# Patient Record
Sex: Male | Born: 1980
Health system: Southern US, Community
[De-identification: ages and names within clinical notes are randomized; demographics above are authoritative.]

## PROBLEM LIST (undated history)

## (undated) DIAGNOSIS — F840 Autistic disorder: Secondary | ICD-10-CM

## (undated) DIAGNOSIS — I10 Essential (primary) hypertension: Secondary | ICD-10-CM

## (undated) DIAGNOSIS — F79 Unspecified intellectual disabilities: Secondary | ICD-10-CM

## (undated) DIAGNOSIS — F32A Depression, unspecified: Secondary | ICD-10-CM

## (undated) DIAGNOSIS — E785 Hyperlipidemia, unspecified: Secondary | ICD-10-CM

## (undated) HISTORY — DX: Essential (primary) hypertension: I10

## (undated) HISTORY — DX: Depression, unspecified: F32.A

## (undated) HISTORY — DX: Hyperlipidemia, unspecified: E78.5

## (undated) HISTORY — DX: Unspecified intellectual disabilities: F79

---
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
## 2002-10-15 ENCOUNTER — Emergency Department (HOSPITAL_COMMUNITY): Admission: EM | Admit: 2002-10-15 | Discharge: 2002-10-15 | Payer: Self-pay | Admitting: Emergency Medicine

## 2005-04-01 ENCOUNTER — Emergency Department (HOSPITAL_COMMUNITY): Admission: EM | Admit: 2005-04-01 | Discharge: 2005-04-01 | Payer: Self-pay | Admitting: Emergency Medicine

## 2012-09-07 ENCOUNTER — Ambulatory Visit: Payer: Self-pay | Admitting: Family Medicine

## 2012-12-06 ENCOUNTER — Encounter: Payer: Self-pay | Admitting: Family Medicine

## 2012-12-06 ENCOUNTER — Ambulatory Visit (INDEPENDENT_AMBULATORY_CARE_PROVIDER_SITE_OTHER): Payer: Medicaid Other | Admitting: Family Medicine

## 2012-12-06 VITALS — BP 118/78 | HR 76 | Resp 16 | Ht 70.0 in | Wt 206.0 lb

## 2012-12-06 DIAGNOSIS — Z23 Encounter for immunization: Secondary | ICD-10-CM

## 2012-12-06 DIAGNOSIS — E663 Overweight: Secondary | ICD-10-CM

## 2012-12-06 DIAGNOSIS — Z13220 Encounter for screening for lipoid disorders: Secondary | ICD-10-CM

## 2012-12-06 DIAGNOSIS — G931 Anoxic brain damage, not elsewhere classified: Secondary | ICD-10-CM

## 2012-12-06 DIAGNOSIS — Z6825 Body mass index (BMI) 25.0-25.9, adult: Secondary | ICD-10-CM

## 2012-12-06 DIAGNOSIS — F79 Unspecified intellectual disabilities: Secondary | ICD-10-CM

## 2012-12-06 DIAGNOSIS — R5381 Other malaise: Secondary | ICD-10-CM

## 2012-12-06 DIAGNOSIS — I6789 Other cerebrovascular disease: Secondary | ICD-10-CM

## 2012-12-06 DIAGNOSIS — E669 Obesity, unspecified: Secondary | ICD-10-CM | POA: Insufficient documentation

## 2012-12-06 NOTE — Patient Instructions (Addendum)
F/u in mid October.  Please cut back on cookies and chips, eat  More fruit and vegetables, drimnk water.  Keep active, 30 to 45 minutes daily  Weight loss goal of 7 to 10  Pounds  I think you should look into helping with animals  TDaP today  Exam today is excellent, just need to lose a little weight  CBC, fasting lipid, chem 7, TSH this week

## 2012-12-06 NOTE — Addendum Note (Signed)
Addended by: Abner Greenspan on: 12/06/2012 09:03 AM   Modules accepted: Orders

## 2012-12-06 NOTE — Addendum Note (Signed)
Addended by: Abner Greenspan on: 12/06/2012 08:46 AM   Modules accepted: Orders

## 2012-12-06 NOTE — Progress Notes (Signed)
  Subjective:    Patient ID: Gilbert Cohen, male    DOB: 04/27/1981, 32 y.o.   MRN: [id number]  HPI Pt in for establishment of care. He has brain injury as a neonate per Mom, has difficulty expressing himself, but is able to answer questions appropriately.States he was jaundiced, and she witnessed what appears to be aspiration with feed from his nostrils and unresponsiveness . Mother states she was hospitalized for 3 months prior to his birth, she had preterm labour and had cervical cerclage.  Goes to mental health, and is on citalopram only.Has no named psychiatric illness, has been on this for approx 2 years. Mom states in school he was "just slow" never had behavioral issues at schoool No h/o legal altercations or incarceration Has 2 sisters who have no mental health illness  Review of Systems History from Mom  Denies recent fever or chills. Denies sinus pressure, nasal congestion, ear pain or sore throat. Denies chest congestion, productive cough or wheezing. Denies chest pains, palpitations and leg swelling Denies abdominal pain, nausea, vomiting,diarrhea or constipation.   Denies dysuria, frequency, hesitancy or incontinence. Denies joint pain, swelling and limitation in mobility. Denies headaches, seizures, numbness, or tingling. Denies depression, anxiety or insomnia. Denies skin break down or rash.        Objective:   Physical Exam  Patient alert and in no cardiopulmonary distress.  HEENT: No facial asymmetry, EOMI, no sinus tenderness,  oropharynx pink and moist.  Neck supple no adenopathy.  Chest: Clear to auscultation bilaterally.  CVS: S1, S2 no murmurs, no S3.  ABD: Soft non tender. Bowel sounds normal.  Ext: No edema  MS: Adequate ROM spine, shoulders, hips and knees.  Skin: Intact, no ulcerations or rash noted.  Psych: Good eye contact, abnormal affect. Not anxious or depressed appearing.  CNS: CN 2-12 intact, power, and sensation normal  throughout.       Assessment & Plan:

## 2012-12-10 LAB — CBC WITH DIFFERENTIAL/PLATELET
Basophils Absolute: 0 10*3/uL (ref 0.0–0.1)
Basophils Relative: 1 % (ref 0–1)
Eosinophils Absolute: 0.3 10*3/uL (ref 0.0–0.7)
Hemoglobin: 15.3 g/dL (ref 13.0–17.0)
Lymphocytes Relative: 49 % — ABNORMAL HIGH (ref 12–46)
MCHC: 34.9 g/dL (ref 30.0–36.0)
MCV: 79.2 fL (ref 78.0–100.0)
Monocytes Absolute: 0.2 10*3/uL (ref 0.1–1.0)
Monocytes Relative: 7 % (ref 3–12)
Neutro Abs: 1.1 10*3/uL — ABNORMAL LOW (ref 1.7–7.7)
Neutrophils Relative %: 35 % — ABNORMAL LOW (ref 43–77)
Platelets: 194 10*3/uL (ref 150–400)
RDW: 15 % (ref 11.5–15.5)
WBC: 3.1 10*3/uL — ABNORMAL LOW (ref 4.0–10.5)

## 2012-12-10 LAB — LIPID PANEL
Cholesterol: 180 mg/dL (ref 0–200)
LDL Cholesterol: 124 mg/dL — ABNORMAL HIGH (ref 0–99)

## 2012-12-10 LAB — BASIC METABOLIC PANEL
Calcium: 9.7 mg/dL (ref 8.4–10.5)
Glucose, Bld: 93 mg/dL (ref 70–99)

## 2013-04-07 ENCOUNTER — Ambulatory Visit: Payer: Medicaid Other | Admitting: Family Medicine

## 2013-05-18 ENCOUNTER — Encounter (INDEPENDENT_AMBULATORY_CARE_PROVIDER_SITE_OTHER): Payer: Self-pay

## 2013-05-18 ENCOUNTER — Ambulatory Visit (INDEPENDENT_AMBULATORY_CARE_PROVIDER_SITE_OTHER): Payer: Medicaid Other | Admitting: Family Medicine

## 2013-05-18 ENCOUNTER — Encounter: Payer: Self-pay | Admitting: Family Medicine

## 2013-05-18 VITALS — BP 114/80 | HR 89 | Resp 16 | Ht 70.0 in | Wt 214.0 lb

## 2013-05-18 DIAGNOSIS — F79 Unspecified intellectual disabilities: Secondary | ICD-10-CM

## 2013-05-18 DIAGNOSIS — Z23 Encounter for immunization: Secondary | ICD-10-CM

## 2013-05-18 DIAGNOSIS — E785 Hyperlipidemia, unspecified: Secondary | ICD-10-CM

## 2013-05-18 DIAGNOSIS — F329 Major depressive disorder, single episode, unspecified: Secondary | ICD-10-CM

## 2013-05-18 DIAGNOSIS — Z6825 Body mass index (BMI) 25.0-25.9, adult: Secondary | ICD-10-CM

## 2013-05-18 DIAGNOSIS — E663 Overweight: Secondary | ICD-10-CM

## 2013-05-18 NOTE — Patient Instructions (Addendum)
F/u in 6 month, call if you need me before  Flu vaccine today  Keep active , and eat a lot of vegetable and fruit  Need to work on weight loss

## 2013-05-18 NOTE — Progress Notes (Signed)
  Subjective:    Patient ID: Gilbert Cohen, male    DOB: 20-Aug-1980, 32 y.o.   MRN: [id number]  HPI The PT is here for follow up and re-evaluation of chronic medical conditions, medication management and review of any available recent lab and radiology data.  Preventive health is updated, specifically  Immunization.   Continues to follow with psychiatry for mental health needs. The PT denies any adverse reactions to current medications since the last visit.  There are no new concerns.  There are no specific complaints       Review of Systems See HPI Pt was left alone in the office today by his Mom, intentionally, in the hope that he would establish a closer relationship with me as his provider. Interview went well. Answered direct questions appropriately, still very sparingly with little eye contact and aversion to physical contact as in a hand shake Denies recent fever or chills. Denies sinus pressure, nasal congestion, ear pain or sore throat. Denies chest congestion, productive cough or wheezing. Denies chest pains, palpitations and leg swelling Denies abdominal pain, nausea, vomiting,diarrhea or constipation.   Denies dysuria, frequency, hesitancy or incontinence. Denies joint pain, swelling and limitation in mobility. Denies headaches, seizures, numbness, or tingling. Denies uncontrolled  depression, anxiety or insomnia. Denies skin break down or rash.        Objective:   Physical Exam  Patient alert and  in no cardiopulmonary distress.  HEENT: No facial asymmetry, EOMI, no sinus tenderness,  oropharynx pink and moist.  Neck supple no adenopathy.  Chest: Clear to auscultation bilaterally.  CVS: S1, S2 no murmurs, no S3.  ABD: Soft non tender. Bowel sounds normal.  Ext: No edema  MS: Adequate ROM spine, shoulders, hips and knees.  Skin: Intact, no ulcerations or rash noted.  Psych: Poor  eye contact, blunted  affect.  not anxious or depressed  appearing.  CNS: CN 2-12 intact, power,  normal throughout.       Assessment & Plan:

## 2013-05-22 DIAGNOSIS — F329 Major depressive disorder, single episode, unspecified: Secondary | ICD-10-CM | POA: Insufficient documentation

## 2013-05-22 DIAGNOSIS — E785 Hyperlipidemia, unspecified: Secondary | ICD-10-CM | POA: Insufficient documentation

## 2013-05-22 NOTE — Assessment & Plan Note (Signed)
Hyperlipidemia:Low fat diet discussed and encouraged.  Updated lab in  6 month

## 2013-05-22 NOTE — Assessment & Plan Note (Signed)
Deteriorated. Patient re-educated about  the importance of commitment to a  minimum of 150 minutes of exercise per week. The importance of healthy food choices with portion control discussed. Encouraged to start a food diary, count calories and to consider  joining a support group. Sample diet sheets offered. Goals set by the patient for the next several months.    

## 2013-05-22 NOTE — Assessment & Plan Note (Signed)
-  Stable, followed by psychiatry.  

## 2013-05-22 NOTE — Assessment & Plan Note (Signed)
Improved interaction with pt at visit, more communicative

## 2013-11-15 ENCOUNTER — Ambulatory Visit (INDEPENDENT_AMBULATORY_CARE_PROVIDER_SITE_OTHER): Payer: Medicare Other | Admitting: Family Medicine

## 2013-11-15 ENCOUNTER — Encounter (INDEPENDENT_AMBULATORY_CARE_PROVIDER_SITE_OTHER): Payer: Self-pay

## 2013-11-15 ENCOUNTER — Encounter: Payer: Self-pay | Admitting: Family Medicine

## 2013-11-15 VITALS — BP 118/82 | HR 77 | Resp 16 | Ht 70.0 in | Wt 212.0 lb

## 2013-11-15 DIAGNOSIS — F329 Major depressive disorder, single episode, unspecified: Secondary | ICD-10-CM

## 2013-11-15 DIAGNOSIS — F32A Depression, unspecified: Secondary | ICD-10-CM

## 2013-11-15 DIAGNOSIS — G931 Anoxic brain damage, not elsewhere classified: Secondary | ICD-10-CM

## 2013-11-15 DIAGNOSIS — I6789 Other cerebrovascular disease: Secondary | ICD-10-CM

## 2013-11-15 DIAGNOSIS — E663 Overweight: Secondary | ICD-10-CM

## 2013-11-15 DIAGNOSIS — F3289 Other specified depressive episodes: Secondary | ICD-10-CM

## 2013-11-15 DIAGNOSIS — R5381 Other malaise: Secondary | ICD-10-CM

## 2013-11-15 DIAGNOSIS — I6782 Cerebral ischemia: Secondary | ICD-10-CM

## 2013-11-15 DIAGNOSIS — R5383 Other fatigue: Secondary | ICD-10-CM

## 2013-11-15 DIAGNOSIS — E785 Hyperlipidemia, unspecified: Secondary | ICD-10-CM

## 2013-11-15 NOTE — Patient Instructions (Addendum)
Annual wellness  In October, call if you need me before  You are doing well, keep it up  You have lost 2 pounds, that is good  Remember to cut back on sugar and fatty foods  cBC fasting lipid, chem 7, TSH and CBC in October , before visit

## 2013-11-15 NOTE — Assessment & Plan Note (Signed)
Hyperlipidemia:Low fat diet discussed and encouraged.  Updated lab needed at/ before next visit.  

## 2013-11-15 NOTE — Assessment & Plan Note (Signed)
Pt unable to tell the tuime on clock face, knows the month, noty the date and ha marked limitation in ability to verbally communicate. He is involved in a group home where he cleans daily, he lives with his Mother and is at baseline and stable

## 2013-11-15 NOTE — Assessment & Plan Note (Signed)
Improved. Pt applauded on succesful weight loss through lifestyle change, and encouraged to continue same. Weight loss goal set for the next several months.  

## 2013-11-15 NOTE — Progress Notes (Signed)
**Note Gilbert-Identified via Obfuscation**    Subjective:    Patient ID: Gilbert Cohen, male    DOB: [birth year], 33 y.o.   MRN: [id number]  HPI Pt in for routine follow up. He states he has no problems, ferels well, is eating well. Denies excessive allergy symptoms, cough, fevr or chills His Mom is not in the room with him, but if she had concerns she would make me aware.i did check after the visit, she has none He is mentally retarded and is therefore an unreliable historian   Review of Systems See HPI Denies recent fever or chills. Denies sinus pressure, nasal congestion, ear pain or sore throat. Denies chest congestion, productive cough or wheezing. Denies chest pains,  Denies abdominal pain, nausea, vomiting,diarrhea or constipation.   nce. Denies joint pain, swelling and limitation in mobility. Denies headaches,  Denies uncontrolled depression, anxiety or insomnia. Denies skin break down or rash.        Objective:   Physical Exam  BP 118/82  Pulse 77  Resp 16  Ht 5\' 10"  (1.778 m)  Wt 212 lb (96.163 kg)  BMI 30.42 kg/m2  SpO2 97% Patient alert and  in no cardiopulmonary distress.  HEENT: No facial asymmetry, EOMI, no sinus tenderness,  oropharynx pink and moist.  Neck supple no adenopathy.  Chest: Clear to auscultation bilaterally.  CVS: S1, S2 no murmurs, no S3.  ABD: Soft non tender. Bowel sounds normal.  Ext: No edema  MS: Adequate ROM spine, shoulders, hips and knees.  Skin: Intact, no ulcerations or rash noted.  Psych: Poor eye contact, flat affect. not anxious or depressed appearing.  CNS: CN 2-12 intact, power,  and sensation normal throughout.       Assessment & Plan:  Dyslipidemia Hyperlipidemia:Low fat diet discussed and encouraged.  Updated lab needed at/ before next visit.   Depression Treated by psychiatry, stable on current medication  Chronic hypoxic-ischemic brain injury Pt unable to tell the tuime on clock face, knows the month, noty the date and ha marked  limitation in ability to verbally communicate. He is involved in a group home where he cleans daily, he lives with his Mother and is at baseline and stable  Overweight (BMI 25.0-29.9) Improved. Pt applauded on succesful weight loss through lifestyle change, and encouraged to continue same. Weight loss goal set for the next several months.

## 2013-11-15 NOTE — Assessment & Plan Note (Signed)
Treated by psychiatry, stable on current medication

## 2014-04-08 LAB — CBC WITH DIFFERENTIAL/PLATELET
BASOS ABS: 0 10*3/uL (ref 0.0–0.1)
BASOS PCT: 1 % (ref 0–1)
Eosinophils Absolute: 0.1 10*3/uL (ref 0.0–0.7)
Eosinophils Relative: 2 % (ref 0–5)
HEMATOCRIT: 44.2 % (ref 39.0–52.0)
HEMOGLOBIN: 14.9 g/dL (ref 13.0–17.0)
Lymphocytes Relative: 41 % (ref 12–46)
Lymphs Abs: 1.6 10*3/uL (ref 0.7–4.0)
MCH: 27.2 pg (ref 26.0–34.0)
MCHC: 33.7 g/dL (ref 30.0–36.0)
MCV: 80.8 fL (ref 78.0–100.0)
MONO ABS: 0.3 10*3/uL (ref 0.1–1.0)
MONOS PCT: 8 % (ref 3–12)
NEUTROS ABS: 1.9 10*3/uL (ref 1.7–7.7)
NEUTROS PCT: 48 % (ref 43–77)
PLATELETS: 222 10*3/uL (ref 150–400)
RBC: 5.47 MIL/uL (ref 4.22–5.81)
RDW: 14.6 % (ref 11.5–15.5)
WBC: 3.9 10*3/uL — ABNORMAL LOW (ref 4.0–10.5)

## 2014-04-08 LAB — BASIC METABOLIC PANEL
BUN: 11 mg/dL (ref 6–23)
CHLORIDE: 105 meq/L (ref 96–112)
CO2: 26 meq/L (ref 19–32)
Calcium: 10.2 mg/dL (ref 8.4–10.5)
Creat: 1.25 mg/dL (ref 0.50–1.35)
Glucose, Bld: 96 mg/dL (ref 70–99)
POTASSIUM: 4.7 meq/L (ref 3.5–5.3)
Sodium: 141 mEq/L (ref 135–145)

## 2014-04-08 LAB — LIPID PANEL
CHOL/HDL RATIO: 5.8 ratio
Cholesterol: 185 mg/dL (ref 0–200)
HDL: 32 mg/dL — AB (ref >39)
LDL Cholesterol: 134 mg/dL — ABNORMAL HIGH (ref 0–99)
Triglycerides: 95 mg/dL (ref <150)
VLDL: 19 mg/dL (ref 0–40)

## 2014-04-08 LAB — TSH: TSH: 2.234 u[IU]/mL (ref 0.350–4.500)

## 2014-04-14 ENCOUNTER — Ambulatory Visit (INDEPENDENT_AMBULATORY_CARE_PROVIDER_SITE_OTHER): Payer: Medicare Other | Admitting: Family Medicine

## 2014-04-14 ENCOUNTER — Encounter: Payer: Self-pay | Admitting: Family Medicine

## 2014-04-14 ENCOUNTER — Encounter (INDEPENDENT_AMBULATORY_CARE_PROVIDER_SITE_OTHER): Payer: Self-pay

## 2014-04-14 VITALS — BP 120/80 | HR 98 | Resp 16 | Ht 70.0 in | Wt 198.1 lb

## 2014-04-14 DIAGNOSIS — F89 Unspecified disorder of psychological development: Secondary | ICD-10-CM

## 2014-04-14 DIAGNOSIS — Z23 Encounter for immunization: Secondary | ICD-10-CM

## 2014-04-14 DIAGNOSIS — F840 Autistic disorder: Secondary | ICD-10-CM

## 2014-04-14 DIAGNOSIS — Z0000 Encounter for general adult medical examination without abnormal findings: Secondary | ICD-10-CM | POA: Diagnosis not present

## 2014-04-14 NOTE — Assessment & Plan Note (Signed)
Annual exam as documented. Counseling done  re healthy lifestyle involving commitment to 150 minutes exercise per week, heart healthy diet, and attaining healthy weight.The importance of adequate sleep also discussed. Regular seat belt use  is also discussed.  Immunization  needs are specifically addressed at this visit.

## 2014-04-14 NOTE — Patient Instructions (Signed)
F/u in 6 month, call if you need me before  Flu vaccine today  Please cut back on fried and fatty foods , also sweets, bad cholesterol is slightly high, and blood sugar is upper normal

## 2014-04-14 NOTE — Progress Notes (Signed)
**Note Gilbert-Identified via Obfuscation** Subjective:    Patient ID: Gilbert Cohen, male    DOB: 1980/07/26, 33 y.o.   MRN: [id number]  HPI Preventive Screening-Counseling & Management   Patient present here today for a Medicare annual wellness visit.   Current Problems (verified)   Medications Prior to Visit Allergies (verified)   PAST HISTORY  Family History (verified)   Social History Currently lives with his mother, never smoked   Risk Factors  Current exercise habits:  States he walks a lot at home   Dietary issues discussed: encouraged to eat a heart healthy diet with a lot of fruits and vegetables and to limit red meat and fried foods    Cardiac risk factors: none  Depression Screen  (Note: if answer to either of the following is "Yes", a more complete depression screening is indicated)   Over the past two weeks, have you felt down, depressed or hopeless? No  Over the past two weeks, have you felt little interest or pleasure in doing things? No  Have you lost interest or pleasure in daily life? No  Do you often feel hopeless? No  Do you cry easily over simple problems? No   Activities of Daily Living  In your present state of health, do you have any difficulty performing the following activities?  Driving?: doesn't drive  Managing money?: mother handles finances  Feeding yourself?:no  Getting from bed to chair?:No Climbing a flight of stairs?:No Preparing food and eating?:No Bathing or showering?:No Getting dressed?:No Getting to the toilet?:No Using the toilet?:No Moving around from place to place?: No  Fall Risk Assessment In the past year have you fallen or had a near fall?:No Are you currently taking any medications that make you dizzy?:No   Hearing Difficulties: No Do you often ask people to speak up or repeat themselves?:No Do you experience ringing or noises in your ears?:No Do you have difficulty understanding soft or whispered voices?:No  Cognitive Testing  Alert? Yes  Normal Appearance?Yes,some intellectual disability  Oriented to person? Yes Place? Yes  Time? Yes  Displays appropriate judgment?Yes  Can read the correct time from a watch face? yes Are you having problems remembering things?No  Advanced Directives have been discussed with the patient?Yes, he is full code per Mom    List the Names of Other Physician/Practitioners you currently use:  Psychiatry, mental health  Indicate any recent Medical Services you may have received from other than [facility] providers in the past year (date may be approximate).   Assessment:    Annual Wellness Exam   Plan:     Medicare Attestation  I have personally reviewed:  The patient's medical and social history  Their use of alcohol, tobacco or illicit drugs  Their current medications and supplements  The patient's functional ability including ADLs,fall risks, home safety risks, cognitive, and hearing and visual impairment  Diet and physical activities  Evidence for depression or mood disorders  The patient's weight, height, BMI, and visual acuity have been recorded in the chart. I have made referrals, counseling, and provided education to the patient based on review of the above and I have provided the patient with a written personalized care plan for preventive services.      Review of Systems     Objective:   Physical Exam        Assessment & Plan:  Medicare annual wellness visit, initial Annual exam as documented. Counseling done  re healthy lifestyle involving commitment to 150 minutes exercise per week, heart  healthy diet, and attaining healthy weight.The importance of adequate sleep also discussed. Regular seat belt use  is also discussed.  Immunization  needs are specifically addressed at this visit.   Need for prophylactic vaccination and inoculation against influenza Vaccine administered at visit.

## 2014-04-14 NOTE — Assessment & Plan Note (Signed)
Vaccine administered at visit.  

## 2014-10-12 ENCOUNTER — Encounter: Payer: Self-pay | Admitting: Family Medicine

## 2014-10-12 ENCOUNTER — Ambulatory Visit (INDEPENDENT_AMBULATORY_CARE_PROVIDER_SITE_OTHER): Payer: Medicare Other | Admitting: Family Medicine

## 2014-10-12 VITALS — BP 120/84 | HR 88 | Resp 16 | Ht 70.0 in | Wt 198.1 lb

## 2014-10-12 DIAGNOSIS — E663 Overweight: Secondary | ICD-10-CM

## 2014-10-12 DIAGNOSIS — Z139 Encounter for screening, unspecified: Secondary | ICD-10-CM

## 2014-10-12 DIAGNOSIS — H543 Unqualified visual loss, both eyes: Secondary | ICD-10-CM | POA: Insufficient documentation

## 2014-10-12 DIAGNOSIS — F32A Depression, unspecified: Secondary | ICD-10-CM

## 2014-10-12 DIAGNOSIS — F840 Autistic disorder: Secondary | ICD-10-CM

## 2014-10-12 DIAGNOSIS — E785 Hyperlipidemia, unspecified: Secondary | ICD-10-CM | POA: Diagnosis not present

## 2014-10-12 DIAGNOSIS — F329 Major depressive disorder, single episode, unspecified: Secondary | ICD-10-CM

## 2014-10-12 NOTE — Assessment & Plan Note (Signed)
Controlled, no change in medication Followed by psychiatry 

## 2014-10-12 NOTE — Assessment & Plan Note (Signed)
Receiving individual supervision once more, reportedly now asking to be introduced to females

## 2014-10-12 NOTE — Patient Instructions (Signed)
Annual wellness October 4 or after  Call if you need me sooner  It is important that you exercise regularly at least 30 minutes 5 times a week. If you develop chest pain, have severe difficulty breathing, or feel very tired, stop exercising immediately and seek medical attention    CBc, fasting lipid, chem 7, CBc , TSH end September.  Follow low fat diet please  Glad that you now have glasses to help you

## 2014-10-12 NOTE — Progress Notes (Signed)
**Note Gilbert-Identified via Obfuscation** Subjective:    Patient ID: Gilbert Cohen, male    DOB: October 22, 1980, 34 y.o.   MRN: [id number]  HPI The PT is here for follow up and re-evaluation of chronic medical conditions, medication management and review of any available recent lab and radiology data.  Preventive health is updated, specifically  Immunization.   Questions or concerns regarding consultations or procedures which the PT has had in the interim are  Addressed.Currently has a case worker visiting twice weekly and [facility]. Recently got updated glasses The PT denies any adverse reactions to current medications since the last visit.  There are no new concerns voiced by pt or his mother, who is the reliable historian  There are no specific complaints       Review of Systems    See HPI Denies recent fever or chills. Denies sinus pressure, nasal congestion, ear pain or sore throat. Denies chest congestion, productive cough or wheezing. Denies chest pains, palpitations and leg swelling Denies abdominal pain, nausea, vomiting,diarrhea or constipation.   Denies dysuria, frequency, hesitancy or incontinence. Denies joint pain, swelling and limitation in mobility. Denies headaches, seizures, numbness, or tingling. Denies uncontrolled  depression, displaying increased anxiety  Intermittently. Mother denies recent aggressive behavior. Denies skin break down or rash.     Objective:   Physical Exam  BP 120/84 mmHg  Pulse 88  Resp 16  Ht 5\' 10"  (1.778 m)  Wt 198 lb 1.9 oz (89.867 kg)  BMI 28.43 kg/m2  SpO2 99% Patient alert and  in no cardiopulmonary distress.  HEENT: No facial asymmetry, EOMI,   oropharynx pink and moist.  Neck supple no JVD, no mass.  Chest: Clear to auscultation bilaterally.  CVS: S1, S2 no murmurs, no S3.Regular rate.  ABD: Soft non tender.   Ext: No edema  MS: Adequate ROM spine, shoulders, hips and knees.  Skin: Intact, no ulcerations or rash  noted.  Psych: Poor  eye contact, abnormal affect.  anxious not  depressed appearing.  CNS: CN 2-12 intact, power,  normal throughout.no focal deficits noted.       Assessment & Plan:  Overweight (BMI 25.0-29.9) Unchnaged Patient re-educated about  the importance of commitment to a  minimum of 150 minutes of exercise per week.  The importance of healthy food choices with portion control discussed. Encouraged to start a food diary, count calories and to consider  joining a support group. Sample diet sheets offered. Goals set by the patient for the next several months.   Wt Readings from Last 3 Encounters:  10/12/14 198 lb 1.9 oz (89.867 kg)  04/14/14 198 lb 1.9 oz (89.867 kg)  11/15/13 212 lb (96.163 kg)    Body mass index is 28.43 kg/(m^2).  Current exercise per week 150 minutes.    Depression Controlled, no change in medication Followed by psychiatry   Dyslipidemia Hyperlipidemia:Low fat diet discussed and encouraged.  CMP Latest Ref Rng 04/08/2014 12/10/2012  Glucose 70 - 99 mg/dL 96 93  BUN 6 - 23 mg/dL 11 12  Creatinine 4.090.50 - 1.35 mg/dL 8.111.25 9.141.07  Sodium 782135 - 145 mEq/L 141 140  Potassium 3.5 - 5.3 mEq/L 4.7 4.5  Chloride 96 - 112 mEq/L 105 105  CO2 19 - 32 mEq/L 26 30  Calcium 8.4 - 10.5 mg/dL 95.610.2 9.7    Lipid Panel     Component Value Date/Time   CHOL 185 04/08/2014 0814   TRIG 95 04/08/2014 0814   HDL 32*  04/08/2014 0814   CHOLHDL 5.8 04/08/2014 0814   VLDL 19 04/08/2014 0814   LDLCALC 134* 04/08/2014 0814      Updated lab needed at/ before next visit.    Autism spectrum disorder Receiving individual supervision once more, reportedly now asking to be introduced to females

## 2014-10-12 NOTE — Assessment & Plan Note (Signed)
Unchnaged Patient re-educated about  the importance of commitment to a  minimum of 150 minutes of exercise per week.  The importance of healthy food choices with portion control discussed. Encouraged to start a food diary, count calories and to consider  joining a support group. Sample diet sheets offered. Goals set by the patient for the next several months.   Wt Readings from Last 3 Encounters:  10/12/14 198 lb 1.9 oz (89.867 kg)  04/14/14 198 lb 1.9 oz (89.867 kg)  11/15/13 212 lb (96.163 kg)    Body mass index is 28.43 kg/(m^2).  Current exercise per week 150 minutes.

## 2014-10-12 NOTE — Assessment & Plan Note (Signed)
Hyperlipidemia:Low fat diet discussed and encouraged.  CMP Latest Ref Rng 04/08/2014 12/10/2012  Glucose 70 - 99 mg/dL 96 93  BUN 6 - 23 mg/dL 11 12  Creatinine 5.950.50 - 1.35 mg/dL 6.381.25 7.561.07  Sodium 433135 - 145 mEq/L 141 140  Potassium 3.5 - 5.3 mEq/L 4.7 4.5  Chloride 96 - 112 mEq/L 105 105  CO2 19 - 32 mEq/L 26 30  Calcium 8.4 - 10.5 mg/dL 29.510.2 9.7    Lipid Panel     Component Value Date/Time   CHOL 185 04/08/2014 0814   TRIG 95 04/08/2014 0814   HDL 32* 04/08/2014 0814   CHOLHDL 5.8 04/08/2014 0814   VLDL 19 04/08/2014 0814   LDLCALC 134* 04/08/2014 0814      Updated lab needed at/ before next visit.

## 2015-04-21 ENCOUNTER — Other Ambulatory Visit: Payer: Self-pay | Admitting: Family Medicine

## 2015-04-21 LAB — CBC WITH DIFFERENTIAL/PLATELET
Basophils Absolute: 0 10*3/uL (ref 0.0–0.1)
Basophils Relative: 1 % (ref 0–1)
EOS PCT: 3 % (ref 0–5)
Eosinophils Absolute: 0.1 10*3/uL (ref 0.0–0.7)
HEMATOCRIT: 44.9 % (ref 39.0–52.0)
HEMOGLOBIN: 15.2 g/dL (ref 13.0–17.0)
LYMPHS ABS: 1.6 10*3/uL (ref 0.7–4.0)
LYMPHS PCT: 43 % (ref 12–46)
MCH: 27.6 pg (ref 26.0–34.0)
MCHC: 33.9 g/dL (ref 30.0–36.0)
MCV: 81.5 fL (ref 78.0–100.0)
MONO ABS: 0.3 10*3/uL (ref 0.1–1.0)
MONOS PCT: 9 % (ref 3–12)
MPV: 11.4 fL (ref 8.6–12.4)
NEUTROS ABS: 1.7 10*3/uL (ref 1.7–7.7)
Neutrophils Relative %: 44 % (ref 43–77)
Platelets: 204 10*3/uL (ref 150–400)
RBC: 5.51 MIL/uL (ref 4.22–5.81)
RDW: 14.1 % (ref 11.5–15.5)
WBC: 3.8 10*3/uL — AB (ref 4.0–10.5)

## 2015-04-23 LAB — BASIC METABOLIC PANEL
BUN: 11 mg/dL (ref 7–25)
CHLORIDE: 104 mmol/L (ref 98–110)
CO2: 29 mmol/L (ref 20–31)
CREATININE: 1.22 mg/dL (ref 0.60–1.35)
Calcium: 10.1 mg/dL (ref 8.6–10.3)
GLUCOSE: 90 mg/dL (ref 65–99)
Potassium: 4.7 mmol/L (ref 3.5–5.3)
Sodium: 141 mmol/L (ref 135–146)

## 2015-04-23 LAB — LIPID PANEL
CHOL/HDL RATIO: 5.6 ratio — AB (ref <=5.0)
CHOLESTEROL: 168 mg/dL (ref 125–200)
HDL: 30 mg/dL — ABNORMAL LOW (ref >=40)
LDL Cholesterol: 107 mg/dL (ref <130)
TRIGLYCERIDES: 156 mg/dL — AB (ref <150)
VLDL: 31 mg/dL — AB (ref <30)

## 2015-04-24 ENCOUNTER — Encounter: Payer: Self-pay | Admitting: Family Medicine

## 2015-04-24 ENCOUNTER — Ambulatory Visit (INDEPENDENT_AMBULATORY_CARE_PROVIDER_SITE_OTHER): Payer: Medicare Other | Admitting: Family Medicine

## 2015-04-24 VITALS — BP 120/80 | HR 86 | Resp 16 | Ht 70.0 in | Wt 205.0 lb

## 2015-04-24 DIAGNOSIS — B351 Tinea unguium: Secondary | ICD-10-CM | POA: Insufficient documentation

## 2015-04-24 DIAGNOSIS — L0390 Cellulitis, unspecified: Secondary | ICD-10-CM | POA: Insufficient documentation

## 2015-04-24 DIAGNOSIS — Z0000 Encounter for general adult medical examination without abnormal findings: Secondary | ICD-10-CM

## 2015-04-24 DIAGNOSIS — L84 Corns and callosities: Secondary | ICD-10-CM | POA: Insufficient documentation

## 2015-04-24 DIAGNOSIS — Z23 Encounter for immunization: Secondary | ICD-10-CM | POA: Diagnosis not present

## 2015-04-24 DIAGNOSIS — L03818 Cellulitis of other sites: Secondary | ICD-10-CM

## 2015-04-24 LAB — HEPATIC FUNCTION PANEL
ALBUMIN: 4.5 g/dL (ref 3.6–5.1)
ALK PHOS: 52 U/L (ref 40–115)
ALT: 21 U/L (ref 9–46)
AST: 19 U/L (ref 10–40)
BILIRUBIN INDIRECT: 0.9 mg/dL (ref 0.2–1.2)
BILIRUBIN TOTAL: 1.1 mg/dL (ref 0.2–1.2)
Bilirubin, Direct: 0.2 mg/dL (ref <=0.2)
TOTAL PROTEIN: 7.1 g/dL (ref 6.1–8.1)

## 2015-04-24 LAB — TSH: TSH: 1.442 u[IU]/mL (ref 0.350–4.500)

## 2015-04-24 LAB — HIV ANTIBODY (ROUTINE TESTING W REFLEX): HIV 1&2 Ab, 4th Generation: NONREACTIVE

## 2015-04-24 MED ORDER — TERBINAFINE HCL 250 MG PO TABS: 250.0000 mg | ORAL_TABLET | Freq: Every day | ORAL | Status: DC

## 2015-04-24 MED ORDER — CEPHALEXIN 500 MG PO CAPS: 500.0000 mg | ORAL_CAPSULE | Freq: Two times a day (BID) | ORAL | Status: DC

## 2015-04-24 NOTE — Assessment & Plan Note (Signed)

## 2015-04-24 NOTE — Patient Instructions (Signed)
F/u for annual physical exam in 5 month, call if you need me sooner  Excellent labs  Flu vaccine today  Two new medications, for fungal toenail infection, and skin infection of left foot, you are alos referred to podiatrist  Please work on good  health habits so that your health will improve. 1. Commitment to daily physical activity for 30 to 60  minutes, if you are able to do this.  2. Commitment to wise food choices. Aim for half of your  food intake to be vegetable and fruit, one quarter starchy foods, and one quarter protein. Try to eat on a regular schedule  3 meals per day, snacking between meals should be limited to vegetables or fruits or small portions of nuts. 64 ounces of water per day is generally recommended, unless you have specific health conditions, like heart failure or kidney failure where you will need to limit fluid intake.  3. Commitment to sufficient and a  good quality of physical and mental rest daily, generally between 6 to 8 hours per day.  WITH PERSISTANCE AND PERSEVERANCE, THE IMPOSSIBLE , BECOMES THE NORM! [facility], we consider it a privelige to serve you.

## 2015-04-24 NOTE — Progress Notes (Signed)
Subjective:    Patient ID: Gilbert Cohen, male    DOB: [birth year], 34 y.o.   MRN: [id number]  HPI Preventive Screening-Counseling & Management   Patient present here today for a Medicare annual wellness visit.   Current Problems (verified)   Medications Prior to Visit Allergies (verified)   PAST HISTORY   Family History (verified)   Social History Austistic, Lives with mother, never smoker    Risk Factors  Current exercise habits:  Walks around at home   Dietary issues discussed: Heart healthy, low carb, low fat    Cardiac risk factors: none   Depression Screen  (Note: if answer to either of the following is "Yes", a more complete depression screening is indicated)   Over the past two weeks, have you felt down, depressed or hopeless? No  Over the past two weeks, have you felt little interest or pleasure in doing things? No  Have you lost interest or pleasure in daily life? No  Do you often feel hopeless? No  Do you cry easily over simple problems? No   Activities of Daily Living  In your present state of health, do you have any difficulty performing the following activities?  Driving?: doesn't drive , incapable due to MR Managing money?: Mother handles finances  Feeding yourself?:No Getting from bed to chair?:No Climbing a flight of stairs?:No Preparing food and eating?:No Bathing or showering?:No Getting dressed?:No Getting to the toilet?:No Using the toilet?:No Moving around from place to place?: No  Fall Risk Assessment In the past year have you fallen or had a near fall?:No Are you currently taking any medications that make you dizzy?:No   Hearing Difficulties: No Do you often ask people to speak up or repeat themselves?:No Do you experience ringing or noises in your ears?:No Do you have difficulty understanding soft or whispered voices?:No  Cognitive Testing  Alert? Yes Normal Appearance?Yes  Oriented to person? Yes Place? Yes  Time? Yes    Displays appropriate judgment?Yes  Can read the correct time from a watch face? yes Are you having problems remembering things?No  Advanced Directives have been discussed with the patient?Yes, full code per mother     List the Names of Other Physician/Practitioners you currently use:  Psychiatry   Indicate any recent Medical Services you may have received from other than [facility] providers in the past year (date may be approximate).   Assessment:    Annual Wellness Exam   Plan:     Medicare Attestation  I have personally reviewed:  The patient's medical and social history  Their use of alcohol, tobacco or illicit drugs  Their current medications and supplements  The patient's functional ability including ADLs,fall risks, home safety risks, cognitive, and hearing and visual impairment  Diet and physical activities  Evidence for depression or mood disorders  The patient's weight, height, BMI, and visual acuity have been recorded in the chart. I have made referrals, counseling, and provided education to the patient based on review of the above and I have provided the patient with a written personalized care plan for preventive services.      Review of Systems     Objective:   Physical Exam  BP 120/80 mmHg  Pulse 86  Resp 16  Ht  (1.778 m)  Wt 205 lb (92.987 kg)  BMI 29.41 kg/m2  SpO2 97%      Assessment & Plan:  Medicare annual wellness visit, subsequent Annual exam as documented. Counseling done  re healthy  lifestyle involving commitment to 150 minutes exercise per week, heart healthy diet, and attaining healthy weight.The importance of adequate sleep also discussed. Regular seat belt use and home safety, is also discussed. Changes in health habits are decided on by the patient with goals and time frames  set for achieving them. Immunization and cancer screening needs are specifically addressed at this visit.    Cellulitis Left foot infection under 4th and  5th toes, will rx 5 day keflex  Callus of foot Of left foot and long toenails with a lot of fungal infection, refer to podiatry

## 2015-04-24 NOTE — Assessment & Plan Note (Signed)
Of left foot and long toenails with a lot of fungal infection, refer to podiatry

## 2015-04-24 NOTE — Assessment & Plan Note (Signed)
Left foot infection under 4th and 5th toes, will rx 5 day keflex

## 2015-05-08 ENCOUNTER — Encounter: Payer: Self-pay | Admitting: Family Medicine

## 2015-09-05 ENCOUNTER — Encounter: Payer: Self-pay | Admitting: Family Medicine

## 2015-09-05 ENCOUNTER — Ambulatory Visit (INDEPENDENT_AMBULATORY_CARE_PROVIDER_SITE_OTHER): Payer: Medicare Other | Admitting: Family Medicine

## 2015-09-05 VITALS — BP 120/74 | HR 82 | Resp 18 | Ht 70.0 in | Wt 204.0 lb

## 2015-09-05 DIAGNOSIS — Z0000 Encounter for general adult medical examination without abnormal findings: Secondary | ICD-10-CM | POA: Diagnosis not present

## 2015-09-05 NOTE — Assessment & Plan Note (Signed)
Annual exam as documented. . Immunization  are specifically addressed at this visit.

## 2015-09-05 NOTE — Patient Instructions (Signed)
Annual wellness exam 10/18 or after  Fasting labs for October visit, please  Exam today is good

## 2015-09-05 NOTE — Progress Notes (Signed)
   Subjective:    Patient ID: Gilbert Cohen, male    DOB: 02-06-1981, 35 y.o.   MRN: [id number]  HPI Patient is in for annual physical exam. No other health concerns are expressed or addressed at the visit. Recent labs, if available are reviewed. Immunization is reviewed , and  updated if needed.    Review of Systems See HPI        Objective:   Physical Exam  BP 120/74 mmHg  Pulse 82  Resp 18  Ht  (1.778 m)  Wt 204 lb (92.534 kg)  BMI 29.27 kg/m2  SpO2 98%  Pleasant well nourished male, alert and oriented x 3, in no cardio-pulmonary distress. Afebrile. HEENT No facial trauma or asymetry. Sinuses non tender. EOMI,  External ears normal, tympanic membranes clear. Oropharynx moist, no exudate, good dentition. Neck: supple, no adenopathy,JVD or thyromegaly.No bruits.  Chest: Clear to ascultation bilaterally.No crackles or wheezes. Non tender to palpation    Cardiovascular system; Heart sounds normal,  S1 and  S2 ,no S3.  No murmur, or thrill. Apical beat not displaced Peripheral pulses normal.  Abdomen: Soft, non tender, no organomegaly or masses. No bruits. Bowel sounds normal. No guarding, tenderness or rebound.    Musculoskeletal exam: Full ROM of spine, hips , shoulders and knees. No deformity ,swelling or crepitus noted. No muscle wasting or atrophy.   Neurologic: Cranial nerves 2 to 12 intact. Power, tone ,sensation  normal throughout. No disturbance in gait. No tremor.  Skin: Intact, no ulceration, erythema , scaling or rash noted. Pigmentation normal throughout  Psych; AbNormal mood and affect, however unchanged as far as the patient is concerned        Assessment & Plan:  Annual physical exam Annual exam as documented. . Immunization  are specifically addressed at this visit.

## 2016-04-25 ENCOUNTER — Telehealth: Payer: Self-pay | Admitting: Family Medicine

## 2016-04-25 DIAGNOSIS — F32A Depression, unspecified: Secondary | ICD-10-CM

## 2016-04-25 DIAGNOSIS — F329 Major depressive disorder, single episode, unspecified: Secondary | ICD-10-CM

## 2016-04-25 DIAGNOSIS — E785 Hyperlipidemia, unspecified: Secondary | ICD-10-CM

## 2016-04-25 NOTE — Telephone Encounter (Signed)
Please update fasting lab order, going Oct 28th to the lab

## 2016-04-26 NOTE — Telephone Encounter (Signed)
Expired labs reordered  

## 2016-05-03 LAB — LIPID PANEL
CHOL/HDL RATIO: 6.9 ratio — AB (ref <=5.0)
Cholesterol: 186 mg/dL (ref 125–200)
HDL: 27 mg/dL — AB (ref >=40)
LDL CALC: 131 mg/dL — AB (ref <130)
TRIGLYCERIDES: 142 mg/dL (ref <150)
VLDL: 28 mg/dL (ref <30)

## 2016-05-03 LAB — CBC
HCT: 43.6 % (ref 38.5–50.0)
Hemoglobin: 14.7 g/dL (ref 13.2–17.1)
MCH: 27 pg (ref 27.0–33.0)
MCHC: 33.7 g/dL (ref 32.0–36.0)
MCV: 80 fL (ref 80.0–100.0)
MPV: 10.9 fL (ref 7.5–12.5)
PLATELETS: 181 10*3/uL (ref 140–400)
RBC: 5.45 MIL/uL (ref 4.20–5.80)
RDW: 13.9 % (ref 11.0–15.0)
WBC: 3.3 10*3/uL — ABNORMAL LOW (ref 3.8–10.8)

## 2016-05-03 LAB — BASIC METABOLIC PANEL
BUN: 14 mg/dL (ref 7–25)
CALCIUM: 9.5 mg/dL (ref 8.6–10.3)
CO2: 28 mmol/L (ref 20–31)
Chloride: 105 mmol/L (ref 98–110)
Creat: 1.3 mg/dL (ref 0.60–1.35)
Glucose, Bld: 93 mg/dL (ref 65–99)
Potassium: 4.5 mmol/L (ref 3.5–5.3)
SODIUM: 141 mmol/L (ref 135–146)

## 2016-05-03 LAB — TSH: TSH: 1.73 mIU/L (ref 0.40–4.50)

## 2016-05-07 ENCOUNTER — Encounter: Payer: Self-pay | Admitting: Family Medicine

## 2016-05-07 ENCOUNTER — Ambulatory Visit (INDEPENDENT_AMBULATORY_CARE_PROVIDER_SITE_OTHER): Payer: Medicare Other | Admitting: Family Medicine

## 2016-05-07 VITALS — BP 118/82 | HR 94 | Ht 70.0 in | Wt 212.0 lb

## 2016-05-07 DIAGNOSIS — Z0000 Encounter for general adult medical examination without abnormal findings: Secondary | ICD-10-CM | POA: Diagnosis not present

## 2016-05-07 DIAGNOSIS — Z23 Encounter for immunization: Secondary | ICD-10-CM | POA: Diagnosis not present

## 2016-05-07 NOTE — Assessment & Plan Note (Signed)

## 2016-05-07 NOTE — Patient Instructions (Signed)
F/u in 5 months, call if you need me before  Please reduce fatty foods and sweet foods  Start daily physical activity for 30 mins or more  Flu vaccine today

## 2016-05-07 NOTE — Progress Notes (Signed)
Preventive Screening-Counseling & Management   Patient present here today for a Medicare annual wellness visit.   Current Problems (verified)   Medications Prior to Visit Allergies (verified)   PAST HISTORY  Family History (verified)  Social History Lives at home with mother, autistic, doesn't smoke or drink    Risk Factors  Current exercise habits: states he exercises a little, walking around when able   Dietary issues discussed: Low fat, low carb, lots of fruits and vegetables    Cardiac risk factors: none   Depression Screen  (Note: if answer to either of the following is "Yes", a more complete depression screening is indicated)   Over the past two weeks, have you felt down, depressed or hopeless? No  Over the past two weeks, have you felt little interest or pleasure in doing things? No  Have you lost interest or pleasure in daily life? No  Do you often feel hopeless? No  Do you cry easily over simple problems? No   Activities of Daily Living  In your present state of health, do you have any difficulty performing the following activities?  Driving?: has never drove due to MR Managing money?: handled by mother Feeding yourself?:No Getting from bed to chair?:No Climbing a flight of stairs?:No Preparing food and eating?:No Bathing or showering?:No Getting dressed?:No Getting to the toilet?:No Using the toilet?:No Moving around from place to place?: No  Fall Risk Assessment In the past year have you fallen or had a near fall?:No Are you currently taking any medications that make you dizzy?:No   Hearing Difficulties: No Do you often ask people to speak up or repeat themselves?:No Do you experience ringing or noises in your ears?:No Do you have difficulty understanding soft or whispered voices?:No  Cognitive Testing  Alert? Yes Normal Appearance?Yes  Oriented to person? Yes Place? Yes  Time? Yes Displays appropriate judgment?no  Can read the correct time  from a watch face? Yes, correctly verbalized the time  Are you having problems remembering things?No  Advanced Directives have been discussed with the patient?Yes. Full code per mother   List the Names of Other Physician/Practitioners you currently use:  Psychiatry   Indicate any recent Medical Services you may have received from other than [facility] providers in the past year (date may be approximate).     Medicare Attestation  I have personally reviewed:  The patient's medical and social history  Their use of alcohol, tobacco or illicit drugs  Their current medications and supplements  The patient's functional ability including ADLs,fall risks, home safety risks, cognitive, and hearing and visual impairment  Diet and physical activities  Evidence for depression or mood disorders  The patient's weight, height, BMI, and visual acuity have been recorded in the chart. I have made referrals, counseling, and provided education to the patient based on review of the above and I have provided the patient with a written personalized care plan for preventive services.    Physical Exam BP 118/82   Pulse 94   Ht 5\' 10"  (1.778 m)   Wt 212 lb (96.2 kg)   SpO2 97%   BMI 30.42 kg/m    Assessment & Plan:  Medicare annual wellness visit, subsequent Annual exam as documented. Counseling done  re healthy lifestyle involving commitment to 150 minutes exercise per week, heart healthy diet, and attaining healthy weight.The importance of adequate sleep also discussed. Regular seat belt use and home safety, is also discussed. Changes in health habits are decided on by the patient  with goals and time frames  set for achieving them. Immunization and cancer screening needs are specifically addressed at this visit.   Need for prophylactic vaccination and inoculation against influenza After obtaining informed consent, the vaccine is  administered by LPN.

## 2016-05-07 NOTE — Assessment & Plan Note (Signed)
After obtaining informed consent, the vaccine is  administered by LPN.  

## 2016-09-08 ENCOUNTER — Ambulatory Visit (INDEPENDENT_AMBULATORY_CARE_PROVIDER_SITE_OTHER): Payer: Medicare Other | Admitting: Family Medicine

## 2016-09-08 ENCOUNTER — Encounter: Payer: Self-pay | Admitting: Family Medicine

## 2016-09-08 VITALS — BP 114/74 | HR 88 | Resp 15 | Ht 70.0 in | Wt 213.0 lb

## 2016-09-08 DIAGNOSIS — H918X3 Other specified hearing loss, bilateral: Secondary | ICD-10-CM

## 2016-09-08 DIAGNOSIS — E785 Hyperlipidemia, unspecified: Secondary | ICD-10-CM

## 2016-09-08 DIAGNOSIS — E663 Overweight: Secondary | ICD-10-CM

## 2016-09-08 DIAGNOSIS — H9190 Unspecified hearing loss, unspecified ear: Secondary | ICD-10-CM | POA: Insufficient documentation

## 2016-09-08 DIAGNOSIS — F840 Autistic disorder: Secondary | ICD-10-CM | POA: Diagnosis not present

## 2016-09-08 NOTE — Patient Instructions (Addendum)
Annual wellness Nov 2 or after, call if you need me sooner   Please call for lab order in Septemeber  You are referred for hearing testing  Please work on good  health habits so that your health will improve. 1. Commitment to daily physical activity for 30 to 60  minutes, if you are able to do this.  2. Commitment to wise food choices. Aim for half of your  food intake to be vegetable and fruit, one quarter starchy foods, and one quarter protein. Try to eat on a regular schedule  3 meals per day, snacking between meals should be limited to vegetables or fruits or small portions of nuts. 64 ounces of water per day is generally recommended, unless you have specific health conditions, like heart failure or kidney failure where you will need to limit fluid intake.  3. Commitment to sufficient and a  good quality of physical and mental rest daily, generally between 6 to 8 hours per day.  WITH PERSISTANCE AND PERSEVERANCE, THE IMPOSSIBLE , BECOMES THE NORM! Thank you  for choosing [facility]. We consider it a privelige to serve you.  Delivering excellent health care in a caring and  compassionate way is our goal.  Partnering with you,  so that together we can achieve this goal is our strategy.

## 2016-09-08 NOTE — Assessment & Plan Note (Signed)
Deteriorated. Patient re-educated about  the importance of commitment to a  minimum of 150 minutes of exercise per week.  The importance of healthy food choices with portion control discussed. Encouraged to start a food diary, count calories and to consider  joining a support group. Sample diet sheets offered. Goals set by the patient for the next several months.   Weight /BMI 09/08/2016 05/07/2016 09/05/2015  WEIGHT 213 lb 212 lb 204 lb  HEIGHT 5\' 10"  5\' 10"  5\' 10"   BMI 30.56 kg/m2 30.42 kg/m2 29.27 kg/m2

## 2016-09-08 NOTE — Progress Notes (Signed)
**Note Gilbert-Identified via Obfuscation**    Gilbert Cohen     MRN: [id number]      DOB: [birth year]   HPI Gilbert Cohen is here for follow up and re-evaluation of chronic medical conditions, medication management and review of any available recent lab and radiology data.  .  There are no new concerns.  There are no specific complaints   ROS Denies recent fever or chills. Denies sinus pressure, nasal congestion, ear pain or sore throat. Denies chest congestion, productive cough or wheezing. Denies chest pains, palpitations and leg swelling Denies abdominal pain, nausea, vomiting,diarrhea or constipation.   Denies dysuria, frequency, hesitancy or incontinence. Denies joint pain, swelling and limitation in mobility. Denies headaches, seizures, numbness, or tingling. Denies depression, anxiety or insomnia. Denies skin break down or rash.   PE  BP 114/74   Pulse 88   Resp 15   Ht 5\' 10"  (1.778 m)   Wt 213 lb (96.6 kg)   SpO2 99%   BMI 30.56 kg/m   Patient alert  and in no cardiopulmonary distress.  HEENT: No facial asymmetry, EOMI,   oropharynx pink and moist.  Neck supple no JVD, no mass.  Chest: Clear to auscultation bilaterally.  CVS: S1, S2 no murmurs, no S3.Regular rate.  ABD: Soft non tender.   Ext: No edema  MS: Adequate ROM spine, shoulders, hips and knees.  Skin: Intact, no ulcerations or rash noted.    CNS: CN 2-12 intact, power,  normal throughout.no focal deficits noted.   Assessment & Plan  Autism spectrum disorder Unchanged, depends on mom for function  Overweight (BMI 25.0-29.9) Deteriorated. Patient re-educated about  the importance of commitment to a  minimum of 150 minutes of exercise per week.  The importance of healthy food choices with portion control discussed. Encouraged to start a food diary, count calories and to consider  joining a support group. Sample diet sheets offered. Goals set by the patient for the next several months.   Weight /BMI 09/08/2016 05/07/2016 09/05/2015    WEIGHT 213 lb 212 lb 204 lb  HEIGHT 5\' 10"  5\' 10"  5\' 10"   BMI 30.56 kg/m2 30.42 kg/m2 29.27 kg/m2      Hearing loss Mother concerned about hearing loss refer for eval  Dyslipidemia Hyperlipidemia:Low fat diet discussed and encouraged.   Lipid Panel  Lab Results  Component Value Date   CHOL 186 05/03/2016   HDL 27 (L) 05/03/2016   LDLCALC 131 (H) 05/03/2016   TRIG 142 05/03/2016   CHOLHDL 6.9 (H) 05/03/2016      \

## 2016-09-08 NOTE — Assessment & Plan Note (Signed)
Mother concerned about hearing loss refer for eval

## 2016-09-08 NOTE — Assessment & Plan Note (Signed)
Hyperlipidemia:Low fat diet discussed and encouraged.   Lipid Panel  Lab Results  Component Value Date   CHOL 186 05/03/2016   HDL 27 (L) 05/03/2016   LDLCALC 131 (H) 05/03/2016   TRIG 142 05/03/2016   CHOLHDL 6.9 (H) 05/03/2016

## 2016-09-08 NOTE — Assessment & Plan Note (Signed)
Unchanged, depends on mom for function

## 2016-10-02 ENCOUNTER — Ambulatory Visit (INDEPENDENT_AMBULATORY_CARE_PROVIDER_SITE_OTHER): Payer: Medicare Other | Admitting: Otolaryngology

## 2016-10-02 DIAGNOSIS — H93293 Other abnormal auditory perceptions, bilateral: Secondary | ICD-10-CM | POA: Diagnosis not present

## 2016-10-02 DIAGNOSIS — H6122 Impacted cerumen, left ear: Secondary | ICD-10-CM

## 2017-03-05 ENCOUNTER — Emergency Department (HOSPITAL_COMMUNITY)
Admission: EM | Admit: 2017-03-05 | Discharge: 2017-03-06 | Disposition: A | Discharge status: Home / Self Care | Payer: Medicare Other | Attending: Emergency Medicine | Admitting: Emergency Medicine

## 2017-03-05 ENCOUNTER — Encounter (HOSPITAL_COMMUNITY): Payer: Self-pay | Admitting: Emergency Medicine

## 2017-03-05 DIAGNOSIS — R451 Restlessness and agitation: Secondary | ICD-10-CM | POA: Diagnosis present

## 2017-03-05 DIAGNOSIS — R454 Irritability and anger: Secondary | ICD-10-CM | POA: Diagnosis not present

## 2017-03-05 DIAGNOSIS — F329 Major depressive disorder, single episode, unspecified: Secondary | ICD-10-CM | POA: Diagnosis not present

## 2017-03-05 DIAGNOSIS — Z79899 Other long term (current) drug therapy: Secondary | ICD-10-CM | POA: Diagnosis not present

## 2017-03-05 DIAGNOSIS — F32A Depression, unspecified: Secondary | ICD-10-CM

## 2017-03-05 HISTORY — DX: Unspecified intellectual disabilities: F79

## 2017-03-05 HISTORY — DX: Autistic disorder: F84.0

## 2017-03-05 LAB — CBC WITH DIFFERENTIAL/PLATELET
Basophils Absolute: 0 10*3/uL (ref 0.0–0.1)
Basophils Relative: 1 %
EOS ABS: 0.2 10*3/uL (ref 0.0–0.7)
EOS PCT: 5 %
HCT: 45.9 % (ref 39.0–52.0)
HEMOGLOBIN: 15.7 g/dL (ref 13.0–17.0)
LYMPHS ABS: 1.8 10*3/uL (ref 0.7–4.0)
Lymphocytes Relative: 46 %
MCH: 27.8 pg (ref 26.0–34.0)
MCHC: 34.2 g/dL (ref 30.0–36.0)
MCV: 81.2 fL (ref 78.0–100.0)
MONO ABS: 0.2 10*3/uL (ref 0.1–1.0)
MONOS PCT: 6 %
NEUTROS PCT: 42 %
Neutro Abs: 1.6 10*3/uL — ABNORMAL LOW (ref 1.7–7.7)
Platelets: 198 10*3/uL (ref 150–400)
RBC: 5.65 MIL/uL (ref 4.22–5.81)
RDW: 13.7 % (ref 11.5–15.5)
WBC: 3.8 10*3/uL — ABNORMAL LOW (ref 4.0–10.5)

## 2017-03-05 LAB — BASIC METABOLIC PANEL
Anion gap: 10 (ref 5–15)
BUN: 16 mg/dL (ref 6–20)
CHLORIDE: 104 mmol/L (ref 101–111)
CO2: 25 mmol/L (ref 22–32)
CREATININE: 1.25 mg/dL — AB (ref 0.61–1.24)
Calcium: 9.5 mg/dL (ref 8.9–10.3)
GFR calc Af Amer: >60 mL/min (ref >60)
GFR calc non Af Amer: >60 mL/min (ref >60)
GLUCOSE: 102 mg/dL — AB (ref 65–99)
Potassium: 3.8 mmol/L (ref 3.5–5.1)
Sodium: 139 mmol/L (ref 135–145)

## 2017-03-05 LAB — RAPID URINE DRUG SCREEN, HOSP PERFORMED
AMPHETAMINES: NOT DETECTED
Barbiturates: NOT DETECTED
Benzodiazepines: NOT DETECTED
COCAINE: NOT DETECTED
OPIATES: NOT DETECTED
TETRAHYDROCANNABINOL: NOT DETECTED

## 2017-03-05 LAB — ETHANOL: Alcohol, Ethyl (B): <5 mg/dL (ref <5)

## 2017-03-05 MED ORDER — CITALOPRAM HYDROBROMIDE 20 MG PO TABS
40.0000 mg | ORAL_TABLET | Freq: Every morning | ORAL | Status: DC
  Administered 2017-03-06: 40 mg via ORAL
  Filled 2017-03-05 (×2): qty 2
  Filled 2017-03-05: qty 1
  Filled 2017-03-05: qty 2

## 2017-03-05 NOTE — BH Assessment (Signed)
**Note Gilbert-Identified via Obfuscation** Tele Assessment Note    Gilbert Cohen is an 36 y.o. male presenting [facility] follow [facility]. The patient lives with his mother, Gilbert Cohen, and [facility]. He was started on Celexa about four years ago and has been doing well on this medication until recently, the last few months. Mother describes the patient periodically has an angry affect, clinches his fist and gets in her face. The patient has not threatened anyone or himself and has not assaulted anyone. However, mother states this is not typically behavior for the patient. The patients sister, Gilbert Cohen, reports the patient is isolating more and has lost interest in usual pleasures. States the patient sits in his room in the dark. He has been beating on the furniture. Denies property damage. The patient spoke in one word responses to this clinician. Mother reports this is baseline behavior. Mother doesn't have an IQ score for him.   [facility] His next appointment was in October but due to increased aggression his appointment was moved up to today.  His sister drove in from out of town for the appointment. Mother reports, " he holds things in"  And then lashes out like he did at the mental center today. She stated he appeared he might attack someone but did not. The patient didn't have a completed evaluation or medication change and was sent to the hospital.   Gilbert Ruffina Okonkwo, Gilbert Cohen recommends overnight observation and am psychiatric evaluation   Diagnosis: Autism spectrum disorder; MDD, recurrent moderate, without psychosis  Past Medical History:  Past Medical History:  Diagnosis Date  . Autistic spectrum disorder   . Hyperlipidemia   . Intellectual disability   . Mental retardation 1982   hypoxic brain injury at birth    History reviewed. No pertinent surgical history.  Family History:  Family History  Problem Relation Age  of Onset  . Hypertension Mother   . Cancer Sister [phone]       localized breast cancer in 2014    Social History:  reports that he has never smoked. He has never used smokeless tobacco. He reports that he does not drink alcohol or use drugs.  Additional Social History:     CIWA: CIWA-Ar BP: 113/83 Pulse Rate: 80 COWS:    PATIENT STRENGTHS: (choose at least two) General fund of knowledge Supportive family/friends  Allergies: No Known Allergies  Home Medications:  (Not in a hospital admission)  OB/GYN Status:  No LMP for male patient.  General Assessment Data Location of Assessment: [facility] TTS Assessment: In system Is this a Tele or Face-to-Face Assessment?: Tele Assessment Is this an Initial Assessment or a Re-assessment for this encounter?: Initial Assessment Marital status: Single Is patient pregnant?: No Pregnancy Status: No Living Arrangements: Parent Can pt return to current living arrangement?: Yes Admission Status: Involuntary Is patient capable of signing voluntary admission?: Yes Referral Source: Other Insurance type: MCR  Medical Screening Exam [facility] Medical Exam completed: Yes  Crisis Care Plan Living Arrangements: Parent Name of Psychiatrist: [facility], Gilbert Cohen (every 3 or 4 months) Name of Therapist: n/a  Education Status Is patient currently in school?: No Highest grade of school patient has completed: senior  Risk to self with the past 6 months Suicidal Ideation: No Has patient been a risk to self within the past 6 months prior to admission? : No Suicidal Intent: No Has patient had any suicidal  intent within the past 6 months prior to admission? : No Is patient at risk for suicide?: No Suicidal Plan?: No Has patient had any suicidal plan within the past 6 months prior to admission? : No Access to Means: No What has been your use of drugs/alcohol within the last 12 months?: n/a Previous Attempts/Gestures: No How many times?: 0 Intentional  Self Injurious Behavior: None Family Suicide History: Unknown (schizophrenia- paternal ) Persecutory voices/beliefs?: No Depression: Yes Depression Symptoms: Isolating, Loss of interest in usual pleasures Substance abuse history and/or treatment for substance abuse?: No Suicide prevention information given to non-admitted patients: Not applicable  Risk to Others within the past 6 months Homicidal Ideation: No Does patient have any lifetime risk of violence toward others beyond the six months prior to admission? : No Thoughts of Harm to Others: No Current Homicidal Intent: No Current Homicidal Plan: No Access to Homicidal Means: No History of harm to others?: No Assessment of Violence: None Noted Does patient have access to weapons?: No Criminal Charges Pending?: No Does patient have a court date: No Is patient on probation?: No  Psychosis Hallucinations: None noted Delusions: None noted  Mental Status Report Appearance/Hygiene: Unremarkable Eye Contact: Poor Motor Activity: Freedom of movement Speech: Other (Comment) (one word answer) Level of Consciousness: Alert Mood: Euthymic Affect: Angry Anxiety Level: None Thought Processes: Coherent, Relevant Judgement: Unable to Assess Orientation: Unable to assess Obsessive Compulsive Thoughts/Behaviors: None  Cognitive Functioning Concentration: Unable to Assess Memory: Unable to Assess IQ: Below Average Insight: Unable to Assess Impulse Control: Poor Appetite: Poor (only once a day) Weight Loss: 0 Weight Gain: 0 Sleep: Decreased Total Hours of Sleep:  (sleeps during the day and up all night) Vegetative Symptoms: Staying in bed  ADLScreening [facility] Assessment Services) Patient's cognitive ability adequate to safely complete daily activities?: No  Prior Inpatient Therapy Prior Inpatient Therapy: No  Prior Outpatient Therapy Prior Outpatient Therapy: No Does patient have an ACCT team?: No Does patient have Intensive  In-House Services?  : No Does patient have [facility] services? : No Does patient have P4CC services?: No  ADL Screening (condition at time of admission) Patient's cognitive ability adequate to safely complete daily activities?: No Is the patient deaf or have difficulty hearing?: No Does the patient have difficulty seeing, even when wearing glasses/contacts?: No Does the patient have difficulty concentrating, remembering, or making decisions?: Yes       Abuse/Neglect Assessment (Assessment to be complete while patient is alone) Physical Abuse:  (UTA) Verbal Abuse:  (UTA) Sexual Abuse:  (UTA)     Advance Directives (For Healthcare) Does Patient Have a Medical Advance Directive?: No    Additional Information 1:1 In Past 12 Months?: No CIRT Risk: No Elopement Risk: No Does patient have medical clearance?: Yes     Disposition:  Disposition Initial Assessment Completed for this Encounter: Yes Disposition of Patient: Other dispositions Other disposition(s): Other (Comment)     Gilbert Cohen 03/05/2017 6:42 PM

## 2017-03-05 NOTE — ED Notes (Signed)
Sister is visiting. Pt is sitting in chair in room.

## 2017-03-05 NOTE — ED Provider Notes (Signed)
**Note Gilbert-Identified via Obfuscation** [facility]-EMERGENCY DEPT Provider Note   CSN: [id number] Arrival date & time: 03/05/17  1529     History   Chief Complaint Chief Complaint  Patient presents with  . V70.1    HPI Gilbert Cohen is a 36 y.o. male.  The history is provided by the patient and a relative. The history is limited by the condition of the patient and a developmental delay (Autism spectrum disorder, mild intellectual disability).    Pt was seen at 1555. Per Police, pt's family and pt: Pt brought to ED by Police, [facility], after becoming [facility]. Pt states he "doesn't know what happened" PTA. [facility] notes and family state pt's family called the clinic yesterday reporting pt "had a melt down the previous night" and she "had to call Police because he was jumping up and down while upset and she felt he was out of control." Family feels pt has been more depressed over the past several weeks with crying spells and increased sleep. Pt "exhibited aggressive posturing towards his mother specifically in the presence of his sister." Pt "had a tantrum in the small waiting room" [facility]. Pt's mother and sister stated they "did not feel safe with pt in the home." Pt has not tried to harm himself.   Past Medical History:  Diagnosis Date  . Autistic spectrum disorder   . Hyperlipidemia   . Intellectual disability   . Mental retardation 1982   hypoxic brain injury at birth    Patient Active Problem List   Diagnosis Date Noted  . Hearing loss 09/08/2016  . Onychomycosis 04/24/2015  . Callus of foot 04/24/2015  . Impaired vision in both eyes 10/12/2014  . Developmental disability 04/14/2014  . Autism spectrum disorder 04/14/2014  . Depression 05/22/2013  . Dyslipidemia 05/22/2013  . Chronic hypoxic-ischemic brain injury (HCC) 12/06/2012  . Overweight (BMI 25.0-29.9) 12/06/2012    History reviewed. No pertinent surgical history.     Home Medications    Prior to Admission  medications   Medication Sig Start Date End Date Taking? Authorizing Provider  citalopram (CELEXA) 40 MG tablet Take 40 mg by mouth daily.    [provider]    Family History Family History  Problem Relation Age of Onset  . Hypertension Mother   . Cancer Sister [phone]       localized breast cancer in 2014    Social History Social History  Substance Use Topics  . Smoking status: Never Smoker  . Smokeless tobacco: Never Used  . Alcohol use No     Allergies   Patient has no known allergies.   Review of Systems Review of Systems  Unable to perform ROS: Psychiatric disorder     Physical Exam Updated Vital Signs BP 140/83 (BP Location: Right Arm)   Pulse 80   Temp 98.3 F (36.8 C) (Oral)   Resp 18   Ht 5\' 7"  (1.702 m)   Wt 96.6 kg (213 lb)   SpO2 98%   BMI 33.36 kg/m   Physical Exam 1600:  Physical examination:  Nursing notes reviewed; Vital signs and O2 SAT reviewed;  Constitutional: Well developed, Well nourished, Well hydrated, In no acute distress; Head:  Normocephalic, atraumatic; Eyes: EOMI, PERRL, No scleral icterus; ENMT: Mouth and pharynx normal, Mucous membranes moist; Neck: Supple, Full range of motion; Cardiovascular: Regular rate and rhythm; Respiratory: Breath sounds clear, No wheezes.  Speaking full sentences with ease, Normal respiratory effort/excursion; Chest: No deformity, Movement normal;  Abdomen: Nondistended; Extremities: No deformity.; Neuro: Awake, alert. No facial droop. Speech clear. No gross focal motor deficits in extremities. Climbs on and off stretcher easily by himself. Gait steady.; Skin: Color normal, Warm, Dry.; Psych:  Affect flat.    ED Treatments / Results  Labs (all labs ordered are listed, but only abnormal results are displayed)   EKG  EKG Interpretation None       Radiology   Procedures Procedures (including critical care time)  Medications Ordered in ED Medications - No data to display   Initial  Impression / Assessment and Plan / ED Course  I have reviewed the triage vital signs and the nursing notes.  Pertinent labs & imaging results that were available during my care of the patient were reviewed by me and considered in my medical decision making (see chart for details).  MDM Reviewed: previous chart, nursing note and vitals Reviewed previous: labs Interpretation: labs    Results for orders placed or performed during the hospital encounter of 03/05/17  Urine rapid drug screen (hosp performed)  Result Value Ref Range   Opiates NONE DETECTED NONE DETECTED   Cocaine NONE DETECTED NONE DETECTED   Benzodiazepines NONE DETECTED NONE DETECTED   Amphetamines NONE DETECTED NONE DETECTED   Tetrahydrocannabinol NONE DETECTED NONE DETECTED   Barbiturates NONE DETECTED NONE DETECTED  Ethanol  Result Value Ref Range   Alcohol, Ethyl (B) <5 <5 mg/dL  Basic metabolic panel  Result Value Ref Range   Sodium 139 135 - 145 mmol/L   Potassium 3.8 3.5 - 5.1 mmol/L   Chloride 104 101 - 111 mmol/L   CO2 25 22 - 32 mmol/L   Glucose, Bld 102 (H) 65 - 99 mg/dL   BUN 16 6 - 20 mg/dL   Creatinine, Ser 1.61 (H) 0.61 - 1.24 mg/dL   Calcium 9.5 8.9 - 09.6 mg/dL   GFR calc non Af Amer >60 >60 mL/min   GFR calc Af Amer >60 >60 mL/min   Anion gap 10 5 - 15  CBC with Differential  Result Value Ref Range   WBC 3.8 (L) 4.0 - 10.5 K/uL   RBC 5.65 4.22 - 5.81 MIL/uL   Hemoglobin 15.7 13.0 - 17.0 g/dL   HCT 04.5 40.9 - 81.1 %   MCV 81.2 78.0 - 100.0 fL   MCH 27.8 26.0 - 34.0 pg   MCHC 34.2 30.0 - 36.0 g/dL   RDW 91.4 78.2 - 95.6 %   Platelets 198 150 - 400 K/uL   Neutrophils Relative % 42 %   Neutro Abs 1.6 (L) 1.7 - 7.7 K/uL   Lymphocytes Relative 46 %   Lymphs Abs 1.8 0.7 - 4.0 K/uL   Monocytes Relative 6 %   Monocytes Absolute 0.2 0.1 - 1.0 K/uL   Eosinophils Relative 5 %   Eosinophils Absolute 0.2 0.0 - 0.7 K/uL   Basophils Relative 1 %   Basophils Absolute 0.0 0.0 - 0.1 K/uL     1710:  Will have TTS evaluate.   1925:  TTS has evaluated pt: requests to hold pt in ED overnight for re-evaluation tomorrow morning. Holding orders written.     Final Clinical Impressions(s) / ED Diagnoses   Final diagnoses:  None    New Prescriptions New Prescriptions   No medications on file      Samuel Jester, DO 03/05/17 2140

## 2017-03-05 NOTE — ED Triage Notes (Signed)
Pt answers all questions at triage with "nuh uh"  Denies any thoughts of harming self.  [facility]  Pt states he is unsure of why he is here.  Police state county officers handed him off to them in parking lot and they are not sure what happened.

## 2017-03-06 ENCOUNTER — Telehealth: Payer: Self-pay | Admitting: Family Medicine

## 2017-03-06 DIAGNOSIS — R454 Irritability and anger: Secondary | ICD-10-CM

## 2017-03-06 DIAGNOSIS — R451 Restlessness and agitation: Secondary | ICD-10-CM | POA: Diagnosis not present

## 2017-03-06 NOTE — ED Notes (Signed)
Pt given meal tray at this time. NAD noted, denies need for restroom. Sitter outside of room.

## 2017-03-06 NOTE — Consult Note (Signed)
Telepsych Consultation   Reason for Consult: Aggressive Behavior   Referring Physician:  EDP Location of Patient:  Location of Provider: [facility]  Patient Identification: LUGENE BEOUGHER MRN:  [id number] Principal Diagnosis: <principal problem not specified> Diagnosis:   Patient Active Problem List   Diagnosis Date Noted  . Hearing loss [H91.90] 09/08/2016  . Onychomycosis [B35.1] 04/24/2015  . Callus of foot [L84] 04/24/2015  . Impaired vision in both eyes [H54.3] 10/12/2014  . Developmental disability [F89] 04/14/2014  . Autism spectrum disorder [F84.0] 04/14/2014  . Depression [F32.9] 05/22/2013  . Dyslipidemia [E78.5] 05/22/2013  . Chronic hypoxic-ischemic brain injury ([place]) [K09.38, G93.1] 12/06/2012  . Overweight (BMI 25.0-29.9) [E66.3] 12/06/2012    Total Time spent with patient: 30 minutes  Subjective:   Gilbert Cohen is a 36 y.o. male patient admitted with concerns of aggressive behavior. Patient seen via tele- assessment. Patient is awake, alert and oriented to person and place. Reports " mu family asked me to come here, but I don't know why". Patient continues to answers question with "yes and no" responses. Denies suicidal or homicidal ideation during this assessment. Denies auditory or visual hallucination and does not appear to be responding to internal stimuli. Wellington provided verbal permission to speak to his mother Lattie Haw. Mother reports patient has been taken the same medication for the past 4 years and she feels as if the medication need to be adjusted. Reports a recent visit with Psychiatric regarding medication adjustment and states patient became agaited when his sister made suggestion to change his medication. and  Support, encouragement and reassurance was provided.   HPI:  Gilbert Cohen is an 36 y.o. male presenting after he became aggressive at [facility] during a follow up appointment. The patient lives with his mother, Sheldon Sem, and attends a day program at [facility]. He was started on Celexa about four years ago and has been doing well on this medication until recently, the last few months. Mother describes the patient periodically has an angry affect, clinches his fist and gets in her face. The patient has not threatened anyone or himself and has not assaulted anyone. However, mother states this is not typically behavior for the patient. The patients sister, Layla Maw, reports the patient is isolating more and has lost interest in usual pleasures. States the patient sits in his room in the dark. He has been beating on the furniture. Denies property damage. The patient spoke in one word responses to this clinician. Mother reports this is baseline behavior. Mother doesn't have an IQ score for him.   The patient attends treatment at [facility]. His next appointment was in October but due to increased aggression his appointment was moved up to today.  His sister drove in from out of town for the appointment. Mother reports, " he holds things in"  And then lashes out like he did at the mental center today. She stated he appeared he might attack someone but did not. The patient didn't have a completed evaluation or medication change and was sent to the hospital.  Past Psychiatric History:  Risk to Self: Suicidal Ideation: No Suicidal Intent: No Is patient at risk for suicide?: No Suicidal Plan?: No Access to Means: No What has been your use of drugs/alcohol within the last 12 months?: n/a How many times?: 0 Intentional Self Injurious Behavior: None Risk to Others: Homicidal Ideation: No Thoughts of Harm to Others: No Current Homicidal Intent: No Current Homicidal Plan: No Access  to Homicidal Means: No History of harm to others?: No Assessment of Violence: None Noted Does patient have access to weapons?: No Criminal Charges Pending?: No Does patient have a court date: No Prior Inpatient Therapy: Prior Inpatient  Therapy: No Prior Outpatient Therapy: Prior Outpatient Therapy: No Does patient have an ACCT team?: No Does patient have Intensive In-House Services?  : No Does patient have [facility] services? : No Does patient have P4CC services?: No  Past Medical History:  Past Medical History:  Diagnosis Date  . Autistic spectrum disorder   . Hyperlipidemia   . Intellectual disability   . Mental retardation 1982   hypoxic brain injury at birth   History reviewed. No pertinent surgical history. Family History:  Family History  Problem Relation Age of Onset  . Hypertension Mother   . Cancer Sister 74       localized breast cancer in 2014   Family Psychiatric  History:  Social History:  History  Alcohol Use No     History  Drug Use No    Social History   Social History  . Marital status: Single    Spouse name: N/A  . Number of children: N/A  . Years of education: N/A   Social History Main Topics  . Smoking status: Never Smoker  . Smokeless tobacco: Never Used  . Alcohol use No  . Drug use: No  . Sexual activity: Not Currently   Other Topics Concern  . None   Social History Narrative  . None   Additional Social History:    Allergies:  No Known Allergies  Labs:  Results for orders placed or performed during the hospital encounter of 03/05/17 (from the past 48 hour(s))  Urine rapid drug screen (hosp performed)     Status: None   Collection Time: 03/05/17  4:21 PM  Result Value Ref Range   Opiates NONE DETECTED NONE DETECTED   Cocaine NONE DETECTED NONE DETECTED   Benzodiazepines NONE DETECTED NONE DETECTED   Amphetamines NONE DETECTED NONE DETECTED   Tetrahydrocannabinol NONE DETECTED NONE DETECTED   Barbiturates NONE DETECTED NONE DETECTED    Comment:        DRUG SCREEN FOR MEDICAL PURPOSES ONLY.  IF CONFIRMATION IS NEEDED FOR ANY PURPOSE, NOTIFY LAB WITHIN 5 DAYS.        LOWEST DETECTABLE LIMITS FOR URINE DRUG SCREEN Drug Class       Cutoff  (ng/mL) Amphetamine      1000 Barbiturate      200 Benzodiazepine   233 Tricyclics       007 Opiates          300 Cocaine          300 THC              50   Ethanol     Status: None   Collection Time: 03/05/17  4:33 PM  Result Value Ref Range   Alcohol, Ethyl (B) <5 <5 mg/dL    Comment:        LOWEST DETECTABLE LIMIT FOR SERUM ALCOHOL IS 5 mg/dL FOR MEDICAL PURPOSES ONLY   Basic metabolic panel     Status: Abnormal   Collection Time: 03/05/17  4:36 PM  Result Value Ref Range   Sodium 139 135 - 145 mmol/L   Potassium 3.8 3.5 - 5.1 mmol/L   Chloride 104 101 - 111 mmol/L   CO2 25 22 - 32 mmol/L   Glucose, Bld 102 (H) 65 - 99 mg/dL  BUN 16 6 - 20 mg/dL   Creatinine, Ser 1.25 (H) 0.61 - 1.24 mg/dL   Calcium 9.5 8.9 - 10.3 mg/dL   GFR calc non Af Amer >60 >60 mL/min   GFR calc Af Amer >60 >60 mL/min    Comment: (NOTE) The eGFR has been calculated using the CKD EPI equation. This calculation has not been validated in all clinical situations. eGFR's persistently <60 mL/min signify possible Chronic Kidney Disease.    Anion gap 10 5 - 15  CBC with Differential     Status: Abnormal   Collection Time: 03/05/17  4:36 PM  Result Value Ref Range   WBC 3.8 (L) 4.0 - 10.5 K/uL   RBC 5.65 4.22 - 5.81 MIL/uL   Hemoglobin 15.7 13.0 - 17.0 g/dL   HCT 45.9 39.0 - 52.0 %   MCV 81.2 78.0 - 100.0 fL   MCH 27.8 26.0 - 34.0 pg   MCHC 34.2 30.0 - 36.0 g/dL   RDW 13.7 11.5 - 15.5 %   Platelets 198 150 - 400 K/uL   Neutrophils Relative % 42 %   Neutro Abs 1.6 (L) 1.7 - 7.7 K/uL   Lymphocytes Relative 46 %   Lymphs Abs 1.8 0.7 - 4.0 K/uL   Monocytes Relative 6 %   Monocytes Absolute 0.2 0.1 - 1.0 K/uL   Eosinophils Relative 5 %   Eosinophils Absolute 0.2 0.0 - 0.7 K/uL   Basophils Relative 1 %   Basophils Absolute 0.0 0.0 - 0.1 K/uL    Medications:  Current Facility-Administered Medications  Medication Dose Route Frequency Provider Last Rate Last Dose  . citalopram (CELEXA) tablet  40 mg  40 mg Oral q morning - 10a Francine Graven, DO   40 mg at 03/06/17 1023   Current Outpatient Prescriptions  Medication Sig Dispense Refill  . citalopram (CELEXA) 40 MG tablet Take 40 mg by mouth every morning.       Musculoskeletal: Strength & Muscle Tone: within normal limits Gait & Station: UTA- teleassessment Patient leans: N/A  Psychiatric Specialty Exam: Physical Exam  Vitals reviewed. Neurological: He is alert.  Psychiatric: He has a normal mood and affect. His behavior is normal.    Review of Systems  Psychiatric/Behavioral: Negative for hallucinations. The patient does not have insomnia.     Blood pressure 128/78, pulse 69, temperature 98.3 F (36.8 C), temperature source Oral, resp. rate 16, height 5' 7" (1.702 m), weight 96.6 kg (213 lb), SpO2 98 %.Body mass index is 33.36 kg/m.  General Appearance: Casual  Eye Contact:  Fair  Speech:  Clear and Coherent and Slow  Volume:  Normal with fluctuation   Mood:  Irritable  Affect:  Flat  Thought Process:  Coherent  Orientation:  Other:  Person and place  Thought Content:  Hallucinations: None  Suicidal Thoughts:  No  Homicidal Thoughts:  No  Memory:  Immediate;   Fair Recent;   Fair  Judgement:  Other:  dx with developmetnal disability  Insight:  Present and patient was ble to articulated why he was in the hosptial, howvever reports he was unsure why?  Psychomotor Activity:  NA  Concentration:  Concentration: Fair  Recall:  [employer] of Knowledge:  Fair  Language:  Fair  Akathisia:  No  Handed:  Right  AIMS (if indicated):     Assets:  Communication Skills Desire for Improvement Social Support  ADL's:  Intact  Cognition:  Impaired,  Moderate  Sleep:  I agree with current treatment plan on 03/06/2017, Patient seen face-to-face for psychiatric evaluation follow-up, chart reviewed and case discussed with the MD Dwyane Dee  and Treatment team. Reviewed the information documented and agree with the  disposition plan. EDP MD Zamit made aware of current dispositon    Disposition: No evidence of imminent risk to self or others at present.   Patient does not meet criteria for psychiatric inpatient admission. Discussed crisis plan, support from social network, calling 911, coming to the Emergency Department, and calling Suicide Hotline. - TTS's to follow-up with  [facility] for a closer appointment and to provided addition resources.    This service was provided via telemedicine using a 2-way, interactive audio and video technology.  Names of all persons participating in this telemedicine service and their role in this encounter. Name: Eboni Role: RN  Name: Leafy Ro at shift change Role: RN  Name:Zamit Role:MD EDP       Derrill [place], NP 03/06/2017 11:55 AM

## 2017-03-06 NOTE — Progress Notes (Addendum)
Per [facility]-ED RN patient's sister, [facility]. Writer spoke with pt's sister Sharyne RichtersRonyetta Cohen [phone] who reported: "The only reason for us coming to the ED was to get his medication changed". Writer advised that unfortunately, the ED will not be able to prescribe medication. That an outpatient or inpatient psychiatrist would be able to do that. That once patient makes an appt with an OPT psychiatrist or once patient is recommended inpatient treatment and goes to an inpatient psych facility he will be able to get meds there.   Sister stated that the NP [facility] advised them that the ED would change pt's meds and reported understanding that was incorrect. Sister reported that she was on her way to pick up pt. Sister requested OPT psychiatrist contact information. Writer informed sister that Clinical research associatewriter is faxing OPT resources to the ED. [facility]-ED RN Maralyn SagoSarah aware. Resources faxed.  Gilbert Cohen, MSW, LCSWA Clinical social worker in disposition [facility], TTS Office [phone] and [phone] 03/06/2017 1:49 PM

## 2017-03-06 NOTE — ED Notes (Signed)
IVC rescinded. Pt's sister at bedside and discussed d/c instructions and follow up care. Denies further needs. Pt belongings given back at this time. [facility] to re-fax outside resources.

## 2017-03-06 NOTE — ED Notes (Signed)
Patient's mother, Hartley Barefootlisa Bamberg [phone], requests to be called upon evaluation by [facility].

## 2017-03-06 NOTE — Telephone Encounter (Signed)
Mother Mickle Mallory(Elisa) called to say that patient is @ the [facility] ER being evaluated - [facility].  She just wanted to keep you informed should you need to see him.

## 2017-03-06 NOTE — Telephone Encounter (Signed)
Noted and was aware that he is ED will follow

## 2017-03-06 NOTE — ED Notes (Signed)
[facility] spoke to pt's sister and clarified recommendations. [facility] pt's sister is on the way to come pick pt up and will be faxing over outpt resources.

## 2017-03-06 NOTE — ED Notes (Signed)
Pt's sister given [facility] resources. Ambulatory to d/c. Denies SI/HI at this time.

## 2017-03-06 NOTE — ED Notes (Signed)
Gave pt a meal tray  

## 2017-03-06 NOTE — ED Notes (Signed)
Pt.'s sister wants to be very informed of patient's condition. States she wants him to have a different mood stabilizer.

## 2017-03-06 NOTE — Discharge Instructions (Signed)
Go to your out patient appointment that behavior health has set up

## 2017-03-06 NOTE — ED Notes (Signed)
Tele psych in room. 

## 2017-04-08 ENCOUNTER — Telehealth: Payer: Self-pay | Admitting: Family Medicine

## 2017-04-08 ENCOUNTER — Emergency Department (HOSPITAL_COMMUNITY)
Admission: EM | Admit: 2017-04-08 | Discharge: 2017-04-10 | Disposition: A | Discharge status: Home / Self Care | Payer: Medicare Other | Attending: Emergency Medicine | Admitting: Emergency Medicine

## 2017-04-08 ENCOUNTER — Encounter (HOSPITAL_COMMUNITY): Payer: Self-pay | Admitting: Emergency Medicine

## 2017-04-08 DIAGNOSIS — F840 Autistic disorder: Secondary | ICD-10-CM | POA: Insufficient documentation

## 2017-04-08 DIAGNOSIS — Z79899 Other long term (current) drug therapy: Secondary | ICD-10-CM | POA: Insufficient documentation

## 2017-04-08 DIAGNOSIS — F78 Other intellectual disabilities: Secondary | ICD-10-CM | POA: Insufficient documentation

## 2017-04-08 DIAGNOSIS — R4689 Other symptoms and signs involving appearance and behavior: Secondary | ICD-10-CM

## 2017-04-08 LAB — COMPREHENSIVE METABOLIC PANEL
ALBUMIN: 4.9 g/dL (ref 3.5–5.0)
ALK PHOS: 52 U/L (ref 38–126)
ALT: 29 U/L (ref 17–63)
ANION GAP: 7 (ref 5–15)
AST: 28 U/L (ref 15–41)
BILIRUBIN TOTAL: 0.8 mg/dL (ref 0.3–1.2)
BUN: 19 mg/dL (ref 6–20)
CALCIUM: 9.5 mg/dL (ref 8.9–10.3)
CO2: 25 mmol/L (ref 22–32)
Chloride: 109 mmol/L (ref 101–111)
Creatinine, Ser: 1.35 mg/dL — ABNORMAL HIGH (ref 0.61–1.24)
GFR calc non Af Amer: >60 mL/min (ref >60)
GLUCOSE: 103 mg/dL — AB (ref 65–99)
POTASSIUM: 4.4 mmol/L (ref 3.5–5.1)
SODIUM: 141 mmol/L (ref 135–145)
TOTAL PROTEIN: 7.6 g/dL (ref 6.5–8.1)

## 2017-04-08 LAB — RAPID URINE DRUG SCREEN, HOSP PERFORMED
Amphetamines: NOT DETECTED
BENZODIAZEPINES: NOT DETECTED
Barbiturates: NOT DETECTED
COCAINE: NOT DETECTED
OPIATES: NOT DETECTED
Tetrahydrocannabinol: NOT DETECTED

## 2017-04-08 LAB — CBC WITH DIFFERENTIAL/PLATELET
BASOS PCT: 1 %
Basophils Absolute: 0 10*3/uL (ref 0.0–0.1)
EOS ABS: 0.1 10*3/uL (ref 0.0–0.7)
Eosinophils Relative: 2 %
HCT: 44.1 % (ref 39.0–52.0)
Hemoglobin: 14.9 g/dL (ref 13.0–17.0)
LYMPHS ABS: 1.3 10*3/uL (ref 0.7–4.0)
Lymphocytes Relative: 32 %
MCH: 27.3 pg (ref 26.0–34.0)
MCHC: 33.8 g/dL (ref 30.0–36.0)
MCV: 80.9 fL (ref 78.0–100.0)
MONO ABS: 0.3 10*3/uL (ref 0.1–1.0)
MONOS PCT: 8 %
Neutro Abs: 2.3 10*3/uL (ref 1.7–7.7)
Neutrophils Relative %: 57 %
Platelets: 194 10*3/uL (ref 150–400)
RBC: 5.45 MIL/uL (ref 4.22–5.81)
RDW: 13.8 % (ref 11.5–15.5)
WBC: 4.1 10*3/uL (ref 4.0–10.5)

## 2017-04-08 LAB — SALICYLATE LEVEL

## 2017-04-08 LAB — ETHANOL: Alcohol, Ethyl (B): <10 mg/dL (ref <10)

## 2017-04-08 LAB — ACETAMINOPHEN LEVEL

## 2017-04-08 MED ORDER — HYDROXYZINE HCL 25 MG PO TABS
25.0000 mg | ORAL_TABLET | Freq: Four times a day (QID) | ORAL | Status: DC | PRN
Start: 2017-04-08 — End: 2017-04-10

## 2017-04-08 MED ORDER — ONDANSETRON HCL 4 MG PO TABS: 4.0000 mg | ORAL_TABLET | Freq: Three times a day (TID) | ORAL | Status: DC | PRN

## 2017-04-08 MED ORDER — ACETAMINOPHEN 325 MG PO TABS: 650.0000 mg | ORAL_TABLET | ORAL | Status: DC | PRN

## 2017-04-08 MED ORDER — CITALOPRAM HYDROBROMIDE 20 MG PO TABS
40.0000 mg | ORAL_TABLET | Freq: Every day | ORAL | Status: DC
  Administered 2017-04-09: 40 mg via ORAL
  Filled 2017-04-08 (×2): qty 2
  Filled 2017-04-08: qty 1
  Filled 2017-04-08 (×2): qty 2
  Filled 2017-04-08: qty 1

## 2017-04-08 NOTE — ED Provider Notes (Signed)
[facility] Provider Note   CSN: [id number] Arrival date & time: 04/08/17  4098     History   Chief Complaint Chief Complaint  Patient presents with  . V70.1    HPI Gilbert Cohen is a 36 y.o. male.  The patient is verbal. But limited. But has a history of mental retardation and autism. Since July patient's been having unusual behavior that seems to be aggressive and his mother has been concerned. Patient has been evaluated by behavioral health in the past. He is on Celexa and hydroxyzine. Brought in by police on involuntary hold at the request of his mother. His mother is very concerned about aggressive posturing that's occurring at home and she is afraid that he is going to hurt her. His power of attorney is his sister, Ms. Laural Benes at telephone # [phone]. His mother's number is [phone]. Patient has been cooperative here and nonaggressive. Patient as stated was evaluated by your health for similar activity at the end of August.      Past Medical History:  Diagnosis Date  . Autistic spectrum disorder   . Hyperlipidemia   . Intellectual disability   . Mental retardation 1982   hypoxic brain injury at birth    Patient Active Problem List   Diagnosis Date Noted  . Hearing loss 09/08/2016  . Onychomycosis 04/24/2015  . Callus of foot 04/24/2015  . Impaired vision in both eyes 10/12/2014  . Developmental disability 04/14/2014  . Autism spectrum disorder 04/14/2014  . Depression 05/22/2013  . Dyslipidemia 05/22/2013  . Chronic hypoxic-ischemic brain injury (HCC) 12/06/2012  . Overweight (BMI 25.0-29.9) 12/06/2012    History reviewed. No pertinent surgical history.     Home Medications    Prior to Admission medications   Medication Sig Start Date End Date Taking? Authorizing Provider  citalopram (CELEXA) 40 MG tablet Take 40 mg by mouth every morning.    Yes [provider]    Family History Family History  Problem Relation Age of  Onset  . Hypertension Mother   . Cancer Sister 50       localized breast cancer in 2014    Social History Social History  Substance Use Topics  . Smoking status: Never Smoker  . Smokeless tobacco: Never Used  . Alcohol use No     Allergies   Patient has no known allergies.   Review of Systems Review of Systems  Unable to perform ROS: Psychiatric disorder     Physical Exam Updated Vital Signs BP 126/78 (BP Location: Right Arm)   Pulse 85   Temp 98.4 F (36.9 C) (Oral)   Resp 18   Ht 1.702 m ( )   Wt 95.3 kg (210 lb)   SpO2 99%   BMI 32.89 kg/m   Physical Exam  Constitutional: He appears well-developed and well-nourished. No distress.  HENT:  Head: Normocephalic and atraumatic.  Eyes: Pupils are equal, round, and reactive to light. Conjunctivae and EOM are normal.  Neck: Normal range of motion. Neck supple.  Cardiovascular: Normal rate and regular rhythm.   Pulmonary/Chest: Effort normal and breath sounds normal. No respiratory distress.  Abdominal: Soft. Bowel sounds are normal. There is no tenderness.  Musculoskeletal: Normal range of motion.  Neurological: He is alert.  Skin: Skin is warm.  Nursing note and vitals reviewed.    ED Treatments / Results  Labs (all labs ordered are listed, but only abnormal results are displayed) Labs Reviewed  COMPREHENSIVE METABOLIC PANEL - Abnormal; Notable for  the following:       Result Value   Glucose, Bld 103 (*)    Creatinine, Ser 1.35 (*)    All other components within normal limits  ACETAMINOPHEN LEVEL - Abnormal; Notable for the following:    Acetaminophen (Tylenol), Serum <10 (*)    All other components within normal limits  ETHANOL  RAPID URINE DRUG SCREEN, HOSP PERFORMED  CBC WITH DIFFERENTIAL/PLATELET  SALICYLATE LEVEL    EKG  EKG Interpretation None       Radiology No results found.  Procedures Procedures (including critical care time)  Medications Ordered in ED Medications - No  data to display   Initial Impression / Assessment and Plan / ED Course  I have reviewed the triage vital signs and the nursing notes.  Pertinent labs & imaging results that were available during my care of the patient were reviewed by me and considered in my medical decision making (see chart for details).   patient medically cleared for evaluation by behavioral health. Behavior health is not feel he meets criteria for behavioral health admission will probably need to have case management and social worker get involved. Patient mother feels that he is unsafe to be at home. Patient been cooperative here. Patient's involuntary commitment paperwork completed.    Final Clinical Impressions(s) / ED Diagnoses   Final diagnoses:  Autistic disorder  Aggressive behavior    New Prescriptions New Prescriptions   No medications on file     Vanetta Mulders, MD 04/08/17 2328

## 2017-04-08 NOTE — BH Assessment (Signed)
Tele Assessment Note   Patient Name: Gilbert Cohen MRN: [id number] Referring Physician: Dr. Vanetta Mulders Location of Patient: [facility] Location of Provider: Behavioral Health TTS Department  Gilbert Cohen is an 36 y.o. male.  -Clinician reviewed note by Dr. Deretha Emory.  The patient is verbal. But limited. But has a history of mental retardation and autism. Since July patient's been having unusual behavior that seems to be aggressive and his mother has been concerned. Patient has been evaluated by behavioral health in the past. He is on Celexa and hydroxyzine. Brought in by police on involuntary hold at the request of his mother. His mother is very concerned about aggressive posturing that's occurring at home and she is afraid that he is going to hurt her. His power of attorney is his sister, Ms. Laural Benes at telephone # [phone]. His mother's number is [phone]. Patient has been cooperative here and nonaggressive. Patient as stated was evaluated by your health for similar activity at the end of August.  Patient's mother took out IVC papers on patient after patient was behaving in an aggressive manner at home.  He was brought [facility] for evaluation.  Patient denies SI, HI or A/V hallucinations.  Patient has only one word answers for questions, does not elaborate.  Clinician called mother to get more information.  Patient gave verbal permission to call mother.  Sister is patient's POA.  Patient is his own guardian.  Mother said that patient has been acting aggressively towards her lately.  He has not hit her or pushed her but will yell and her and get close to her and be physically threatening.  He will ball up his fists as if to hit at times.  He did this on August 30 and had IVC papers taken out.  Patient was returned home.    Mother said that she does not know why he is acting so aggressive.  She is afraid of him now.  She said that it is just her and him in the home.  On Sunday, he  was being aggressive like this but was able to be redirected.  Patient goes to a day program called [facility] in [place].  Patient came home from the day program today and was acting aggressive, following mother.  She called a friend who invited her to church.  Patient said she went into her bathroom to change clothes.  When she opened the door patient was standing there and would not let her leave bathroom.  Patient then called police.  Even when police arrived, patient would follow mother around in an aggressive manner.  Mother then went and filed IVC papers.  Mother is unsure of him coming back home now, she feels unsafe.  Mother said that patient receives [facility].  His sister is his POA.  Pt's next appointment at [facility] is 04-14-17.  -Clinician discussed patient care with Donell Sievert, PA who recommended patient be observed for safety and stabilization and to have patient seen in AM for further dispositioning.  Patient may need social work consult too regarding possible placement.  Clinician discussed with Dr. Deretha Emory also.   Diagnosis: Autism Spectrum d/o; Mild I/DD  Past Medical History:  Past Medical History:  Diagnosis Date  . Autistic spectrum disorder   . Hyperlipidemia   . Intellectual disability   . Mental retardation 1982   hypoxic brain injury at birth    History reviewed. No pertinent surgical history.  Family History:  Family History  Problem Relation Age of Onset  . Hypertension Mother   . Cancer Sister 45       localized breast cancer in 2014    Social History:  reports that he has never smoked. He has never used smokeless tobacco. He reports that he does not drink alcohol or use drugs.  Additional Social History:  Alcohol / Drug Use Pain Medications: See PTA medication list Prescriptions: See PTA medication list Over the Counter: See PTA medication list History of alcohol / drug use?: No history of alcohol / drug abuse  CIWA:  CIWA-Ar BP: 126/78 Pulse Rate: 85 COWS:    PATIENT STRENGTHS: (choose at least two) Physical Health Supportive family/friends  Allergies: No Known Allergies  Home Medications:  (Not in a hospital admission)  OB/GYN Status:  No LMP for male patient.  General Assessment Data Location of Assessment: [facility] TTS Assessment: In system Is this a Tele or Face-to-Face Assessment?: Tele Assessment Is this an Initial Assessment or a Re-assessment for this encounter?: Initial Assessment Marital status: Single Is patient pregnant?: No Pregnancy Status: No Living Arrangements: Parent (Lives with mother) Can pt return to current living arrangement?: Yes Admission Status: Involuntary Is patient capable of signing voluntary admission?: No Referral Source: Self/Family/Friend Insurance type: [facility]     Crisis Care Plan Living Arrangements: Parent (Lives with mother) Legal Guardian: Other: Sharyne Richters [phone] sister & POA) Name of Psychiatrist: Floydene Flock, NP Wyatt Portela, NP) Name of Therapist: n/a  Education Status Is patient currently in school?: No Highest grade of school patient has completed: senior  Risk to self with the past 6 months Suicidal Ideation: No Has patient been a risk to self within the past 6 months prior to admission? : No Suicidal Intent: No Has patient had any suicidal intent within the past 6 months prior to admission? : No Is patient at risk for suicide?: No Suicidal Plan?: No Has patient had any suicidal plan within the past 6 months prior to admission? : No Access to Means: No What has been your use of drugs/alcohol within the last 12 months?: N/A Previous Attempts/Gestures: No How many times?: 0 Other Self Harm Risks: None Triggers for Past Attempts: None known Intentional Self Injurious Behavior: None Family Suicide History: No Recent stressful life event(s): Other (Comment) (Mother does not know why his behavior changed) Persecutory  voices/beliefs?: No Depression: Yes Depression Symptoms: Feeling worthless/self pity, Feeling angry/irritable Substance abuse history and/or treatment for substance abuse?: No Suicide prevention information given to non-admitted patients: Not applicable  Risk to Others within the past 6 months Homicidal Ideation: No Does patient have any lifetime risk of violence toward others beyond the six months prior to admission? : No Thoughts of Harm to Others: No Current Homicidal Intent: No Current Homicidal Plan: No Access to Homicidal Means: No Identified Victim: No one History of harm to others?: No Assessment of Violence: None Noted Violent Behavior Description: None Does patient have access to weapons?: No Criminal Charges Pending?: No Does patient have a court date: No Is patient on probation?: No  Psychosis Hallucinations: None noted Delusions: None noted  Mental Status Report Appearance/Hygiene: Unremarkable, In scrubs Eye Contact: Poor Motor Activity: Freedom of movement Speech: Other (Comment) (One word answers) Level of Consciousness: Alert Mood: Anxious Affect: Anxious Anxiety Level: Moderate Thought Processes: Unable to Assess Judgement: Unimpaired Orientation: Unable to assess Obsessive Compulsive Thoughts/Behaviors: None  Cognitive Functioning Concentration: Unable to Assess Memory: Unable to Assess IQ: Below Average Level of Function: Mild, unsure Insight:  Poor Impulse Control: Poor Appetite: Good Weight Loss: 0 Weight Gain: 0 Sleep: No Change Total Hours of Sleep: 8 Vegetative Symptoms: None  ADLScreening [facility] Assessment Services) Patient's cognitive ability adequate to safely complete daily activities?: Yes Patient able to express need for assistance with ADLs?: Yes Independently performs ADLs?: Yes (appropriate for developmental age)  Prior Inpatient Therapy Prior Inpatient Therapy: No Prior Therapy Dates: N/A Prior Therapy Facilty/Provider(s):  N/A Reason for Treatment: N/A  Prior Outpatient Therapy Prior Outpatient Therapy: Yes Prior Therapy Dates: 5-6 years Prior Therapy Facilty/Provider(s): [facility] in [place] Reason for Treatment: med management Does patient have an ACCT team?: No Does patient have Intensive In-House Services?  : No Does patient have [facility] services? : No Does patient have P4CC services?: No  ADL Screening (condition at time of admission) Patient's cognitive ability adequate to safely complete daily activities?: Yes Is the patient deaf or have difficulty hearing?: No Does the patient have difficulty seeing, even when wearing glasses/contacts?: No (Glasses) Does the patient have difficulty concentrating, remembering, or making decisions?: Yes (Autism dx) Patient able to express need for assistance with ADLs?: Yes Does the patient have difficulty dressing or bathing?: Yes (Pt can bathe self.) Independently performs ADLs?: Yes (appropriate for developmental age) Does the patient have difficulty walking or climbing stairs?: No Weakness of Legs: None Weakness of Arms/Hands: None       Abuse/Neglect Assessment (Assessment to be complete while patient is alone) Physical Abuse: Denies Verbal Abuse: Denies Sexual Abuse: Denies Exploitation of patient/patient's resources: Denies Self-Neglect: Denies     Merchant navy officer (For Healthcare) Does Patient Have a Medical Advance Directive?: No Would patient like information on creating a medical advance directive?: No - Patient declined    Additional Information 1:1 In Past 12 Months?: No CIRT Risk: No Elopement Risk: No Does patient have medical clearance?: Yes     Disposition:  Disposition Initial Assessment Completed for this Encounter: Yes Disposition of Patient: Other dispositions (PT to be reviewed by PA) Other disposition(s): Other (Comment) (Pt to be reviewed with PA)  This service was provided via telemedicine using a 2-way,  interactive audio and video technology.  Names of all persons participating in this telemedicine service and their role in this encounter. Name: Darshawn Boateng Role: mother  Name:  Role:   Name:  Role:   Name:  Role:     Alexandria Lodge 04/08/2017 11:55 PM

## 2017-04-08 NOTE — Telephone Encounter (Signed)
I do not recall the form being brought/faxed in. Do you have?

## 2017-04-08 NOTE — ED Triage Notes (Signed)
[employer] reports they were called out to his mother's residence today bc pt was anxious and standing very close to his mother and talking very loudly. PT denies any SI/HI or hallucinations or drug use.

## 2017-04-08 NOTE — Telephone Encounter (Signed)
Patients mother wants to know if we received patient FL2 form  Cb#: [phone]

## 2017-04-09 DIAGNOSIS — F840 Autistic disorder: Secondary | ICD-10-CM | POA: Diagnosis not present

## 2017-04-09 NOTE — ED Notes (Signed)
Pt ate everything from breakfast tray

## 2017-04-09 NOTE — ED Notes (Signed)
Pt eating lunch tray  

## 2017-04-09 NOTE — Telephone Encounter (Signed)
No I have not

## 2017-04-09 NOTE — Progress Notes (Signed)
The patient is recommended for a psychiatric evaluation this morning. Patient lives with his mother, Gilbert Cohen. His sister, Gilbert Cohen is his Healthcare POA.   CSW contacted the patient's mother, Gilbert Cohen ([phone]) to discuss collateral information regarding the patient.   Per Gilbert Cohen, she didn't sleep well last night due to thoughts about her son's behavior from yesterday. Gilbert Cohen stated that the patient goes to [facility] for medication management  and a day-program daily for 5-6 years. She reports that the patient has never acted out in this manner prior to July of this year.  Gilbert Cohen reports that the patient has became aggressive at home, however the patient has never physically attacked her. She states that yesterday the patient continuosly  stood over her in an aggressive posture, as if he was going to attack her.  She also stated that yesterday, the patient continued to followed her around the house and intimidated her with aggressive postures and stares.  Gilbert Cohen reports that she attempted to barricade herself in her bathroom out of fear that the patient was going to physically assault her or worse. She stated that the patient wouldn't let her leave the bathroom.   The patient's mother was very tearful and was crying while explaining the incident yesterday. She states that she is very terrified of the patient and that he cannot return to her home in this state.   Gilbert Cohen reports that the patient continued to follow her when the police arrived ather home. She reports that the police had to keep the patient from following her. She feels as thought the patient purposely intimidates her and knows that she is afraid of home. Gilbert Cohen states she has no additional supports, other than her daughter who lives in [place]. She states that she can no longer care for the patient.    CSW also contacted the patient's sister/ Healthcare POA, Gilbert Cohen [phone]) to discuss collateral information  regarding the patient. Per Gilbert Cohen, has been working with the patient's [facility] Ander Purpura [place], [phone]) to find a group home placement.   Per Gilbert Cohen, she spoke with the patient's day-program director, Clabe Seal  [phone]) and he doesn't think its appropriate for the patient to return home at this time.   Gilbert Cohen also reports that the patient is  is non-verbal. She feels as though the patient intimidates his mother to get his way. She stated that when he is brought to the ED he is calm, but once he returns home, the behaviors return.   Gilbert Cohen is very active in the patient's care and has been working vehemently on finding placement. CSW advised Gilbert Cohen that she should  contact the patient's Care Coordinator to discuss a possible emergency placement until they are able to find a group home placement with a higher level of care.      Gilbert Cohen stated the last time the patient was sent to the ED, she was under the impression that he could have  medication recommendations and be observed for a couple days.  CSW informed Gilbert Cohen that the patient would be re-evaluated this morning by a psychiatric provider and that a disposition would be made based on the whether the patient met criteria for inpatient treatment. Gilbert Cohen voiced understanding and CSW ensured her that someone would reach back out to her once a disposition was decided.   CSW will continue to follow.   Radonna Ricker MSW, [place] Disposition [phone]      .

## 2017-04-09 NOTE — Telephone Encounter (Signed)
Tried to call to inform, numbers are not working

## 2017-04-09 NOTE — Progress Notes (Signed)
CSW received a phone call from the patient's sister and Healthcare POA, Sharyne Richters [phone]).   She provided CSW with a contact number for a [facility], [place] [phone]).   She stated that the patient's care coordinator will contact Dispo CSW tomorrow morning regarding the patient's plan of care.   CSW will continue to follow.   Baldo Daub MSW, LCSWA CSW Disposition [phone]

## 2017-04-10 ENCOUNTER — Encounter (HOSPITAL_COMMUNITY): Payer: Self-pay | Admitting: Emergency Medicine

## 2017-04-10 NOTE — Discharge Instructions (Signed)
You were seen in the ED today with aggressive behavior. The social worker has two possible group home placements to consider. You can continue this process at home. Follow up with your PCP.

## 2017-04-10 NOTE — ED Provider Notes (Signed)
Blood pressure (!) 120/55, pulse 65, temperature (!) 97.4 F (36.3 C), temperature source Oral, resp. rate 18, height  (1.702 m), weight 95.3 kg (210 lb), SpO2 99 %.   In short, Gilbert Cohen is a 36 y.o. male with a chief complaint of V70.1 .  Refer to the original H&P for additional details.  12:29 PM Spoke with psychiatry team is states the patient is clear from their perspective. Unclear if mom will allow the patient back home because of intubating behavior. We will discuss with social work and Sports coach regarding disposition and placement updates.   Patient to be discharged to home while awaiting placement to group home. Two homes are considering.   Alona Bene, MD    Maia Plan, MD 04/10/17 [phone]

## 2017-04-10 NOTE — BH Assessment (Addendum)
[facility] Assessment Progress Note  Pt reassessed this morning. Pt presented with irritable affect and mood, but was cooperative and answered questions. Pt denied SI, HI, AVH. He answered all questions with "uh-uh" or "uh-huh". Clinician attempted to process with pt about him intimidating his mother. Clinician asked pt if he knew what intimidate meant and pt said "like threaten". Clinician asked pt about following his mom around the home, yelling at her, balling his fists up, and getting in her face. Pt didn't definitively admit to doing it, but denied saying anything to her ("I didn't say nothing to her"). Clinician asked pt if he loved his mom ("uh huh"), if he wanted to hurt her or if he had some feeling/compulsion to hurt or scare her ("uh uh"). Clinician asked pt several different times and ways if there was a reason why he was reportedly intimidating his mom but pt was unable to give a reason and appeared to lack insight into the matter. Pt has had no behavioral outburst while in the ED.  Case staffed with Assunta Found, NP, and Dr. Lucianne Muss. Pt is psychiatrically cleared and it is recommended that he be referred for a SW consult to f/u on placement options. Pt has a [facility], [place] [phone]). Dr. Jacqulyn Bath, EDP, notified.     Johny Shock. Ladona Ridgel, MS, NCC, LPCA Counselor

## 2017-04-10 NOTE — ED Notes (Signed)
Pt resting, will arouse, no distress noted, sitter remains at bedside,  

## 2017-04-10 NOTE — ED Notes (Signed)
Pt here after report of attempting to intimidate mother  SW CM has been attempting to find placement for patient, thus far unsuccessful  Here now for "med adjustment" and placement

## 2017-04-10 NOTE — ED Notes (Signed)
Pt lying on right side, no distress noted, sitter at bedside,

## 2017-04-10 NOTE — ED Notes (Signed)
Social work consulted. 

## 2017-04-10 NOTE — Progress Notes (Addendum)
Per Assunta Found, NP and Dr. Lucianne Muss the patient has been Psych Cleared. The patient is recommended for a SOCIAL WORK Consult due to him not being able to return home to his mother. The patient is [facility] CLEAR[facility].    The patient's sister and Healthcare POA is very active in his care and has been working with [facility] to seek group home or adult care placement.   Sister/Healthcare POA, Sharyne Richters [phone]).    [facility] Care Coordinators - [place] [phone]) Derrill Memo [phone]  Dispo CSW attempted to notify [facility] CSW, however there was no answer at this time. CSW left HIPAA compliant voicemail. [facility] CSW, Tretha Sciara [phone]).   Baldo Daub MSW, LCSWA CSW Disposition [phone]

## 2017-04-10 NOTE — ED Notes (Signed)
TTS has reevaluated  Awaiting placement and or dispo

## 2017-04-10 NOTE — ED Notes (Signed)
Mother upset that this RN mentioned placement in front of pt As part of DC instructions  Mother encouraged to return w pt should she have any concerns regarding her or pt safety

## 2017-04-10 NOTE — Consult Note (Signed)
**Note Gilbert-Identified via Obfuscation** Gilbert Cohen, 35 y.o., male patient seen by this provider on 04/10/17.  Chart reviewed and consulted with Dr. Lucianne Muss.  On evaluation [name] responded to most questions with uhm (no) or uha (yes).  Patient denies suicidal/homicidal ideation, psychosis, and paranoia.  Patient had to be asked question that needed a specific answer in order to get him to talk and not a question that required a yes or no answer.  Reported that he worked and where and was able to give his home address.  Sated that he did not know why he was in the hospital.   During interview patient seemed irritated that I was asking him questions and did not want to talk.  He did not have an outburst but did not want to answer questions.  Patient is alert/oriented x 3, calm/cooperative.  He did not appear to be responding to internal or external stimuli.  Patient is psychiatrically cleared.  Social work will work with patient on placement.  Patient is unable to go back home with his mother related to her stating that she is afraid of him coming back home.  Patient sister is his health care power of attorney.     Ismar Yabut B. Malyiah Fellows FNP-BC

## 2017-04-10 NOTE — Clinical Social Work Note (Signed)
LCSW spoke with Genia Harold [phone]. She advised that she is the supervisor of Catarina Hartshorn [phone], who is patient's Care Coordinator at [facility].  She advised that they have two [facility] group homes considering patient for placement. Ms. Terri Skains stated that they will not have to wait for authorization. LCSW discussed that patient was both medically and psychiatrically cleared and he could not remain in the emergency department while placement was sought. Ms. Terri Skains advised that patient's mom is his guardian so she would have to pick him up if we "could not let him stay through the weekend." She stated that she feels mom has "had enough" of patient's behaviors.  LCSW spoke with Catarina Hartshorn, Care Coordinator. She confirmed that two facilities were considering patient. She stated that patient's mother and sister were going to look at the facilities this weekend and asked if patient could stay in the ED through the weekend. LCSW advised that patient could not stay in the ED through the weekend and that someone would need to pick patient up today. She stated that she had been working on a placement for patient for the past six weeks. She stated that she would need to speak with patient's sister and mother to advise his mother to pick patient up.   LCSW spoke with patient's sister, Sharyne Richters [phone]. LCSW confirmed that patient was medically and psych cleared.  LCSW advised that patient would have to be picked up because he could not remain in the ED while placement was being sought. Ms. Laural Benes stated that she would call her mother and let her know.   LCSW spoke with Ms. Laural Benes who indicated that she had spoken with her mother and that her mother would pick patient up.  Antonieta Slaven, Juleen China, LCSW

## 2017-04-10 NOTE — ED Notes (Signed)
Pt lying supine on stretcher, resp even and non labored, no distress noted, sitter remains at bedside,

## 2017-04-10 NOTE — ED Notes (Signed)
Lying on his back, alert, quiet   Does not respond to questions  Awaiting placement/disposition

## 2017-04-17 ENCOUNTER — Other Ambulatory Visit: Payer: Self-pay | Admitting: Family Medicine

## 2017-05-14 ENCOUNTER — Ambulatory Visit (INDEPENDENT_AMBULATORY_CARE_PROVIDER_SITE_OTHER): Payer: Medicare Other

## 2017-05-14 DIAGNOSIS — Z23 Encounter for immunization: Secondary | ICD-10-CM

## 2017-05-25 ENCOUNTER — Ambulatory Visit: Payer: Medicare Other

## 2017-07-13 ENCOUNTER — Ambulatory Visit (INDEPENDENT_AMBULATORY_CARE_PROVIDER_SITE_OTHER): Payer: Medicare Other

## 2017-07-13 DIAGNOSIS — Z111 Encounter for screening for respiratory tuberculosis: Secondary | ICD-10-CM

## 2017-07-15 LAB — TB SKIN TEST
Induration: 0 mm
TB Skin Test: NEGATIVE

## 2017-09-07 ENCOUNTER — Ambulatory Visit (INDEPENDENT_AMBULATORY_CARE_PROVIDER_SITE_OTHER): Payer: Medicare Other | Admitting: Otolaryngology

## 2017-09-07 DIAGNOSIS — T161XXA Foreign body in right ear, initial encounter: Secondary | ICD-10-CM | POA: Diagnosis not present

## 2017-09-07 DIAGNOSIS — H900 Conductive hearing loss, bilateral: Secondary | ICD-10-CM | POA: Diagnosis not present

## 2017-09-07 DIAGNOSIS — H60333 Swimmer's ear, bilateral: Secondary | ICD-10-CM | POA: Diagnosis not present

## 2017-09-14 ENCOUNTER — Ambulatory Visit (INDEPENDENT_AMBULATORY_CARE_PROVIDER_SITE_OTHER): Payer: Medicare Other | Admitting: Otolaryngology

## 2017-09-14 DIAGNOSIS — H903 Sensorineural hearing loss, bilateral: Secondary | ICD-10-CM

## 2017-09-14 DIAGNOSIS — H838X3 Other specified diseases of inner ear, bilateral: Secondary | ICD-10-CM | POA: Diagnosis not present

## 2017-09-14 DIAGNOSIS — H60333 Swimmer's ear, bilateral: Secondary | ICD-10-CM | POA: Diagnosis not present

## 2017-09-24 ENCOUNTER — Encounter: Payer: Self-pay | Admitting: Family Medicine

## 2017-09-24 ENCOUNTER — Ambulatory Visit (INDEPENDENT_AMBULATORY_CARE_PROVIDER_SITE_OTHER): Payer: Medicare Other | Admitting: Family Medicine

## 2017-09-24 VITALS — BP 112/74 | HR 62 | Resp 16 | Ht 70.0 in | Wt 217.0 lb

## 2017-09-24 DIAGNOSIS — F89 Unspecified disorder of psychological development: Secondary | ICD-10-CM | POA: Diagnosis not present

## 2017-09-24 DIAGNOSIS — E669 Obesity, unspecified: Secondary | ICD-10-CM

## 2017-09-24 DIAGNOSIS — F329 Major depressive disorder, single episode, unspecified: Secondary | ICD-10-CM | POA: Diagnosis not present

## 2017-09-24 DIAGNOSIS — F840 Autistic disorder: Secondary | ICD-10-CM | POA: Diagnosis not present

## 2017-09-24 DIAGNOSIS — E785 Hyperlipidemia, unspecified: Secondary | ICD-10-CM

## 2017-09-24 DIAGNOSIS — F32A Depression, unspecified: Secondary | ICD-10-CM

## 2017-09-24 NOTE — Patient Instructions (Addendum)
F/u end August or early September, call if you need me before  Wellness with nurse in 3 months  You need fasting lipid, chem 7 1 week before  next visit  Exam today is good  It is important that you exercise regularly at least 30 minutes 5 times a week. If you develop chest pain, have severe difficulty breathing, or feel very tired, stop exercising immediately and seek medical attention    Please cut back on fried and fatty foods

## 2017-10-03 ENCOUNTER — Encounter: Payer: Self-pay | Admitting: Family Medicine

## 2017-10-03 NOTE — Assessment & Plan Note (Signed)
Incapable of independent living , living in a supervised situation for the first time away from his Mother, and has adjusted well

## 2017-10-03 NOTE — Assessment & Plan Note (Signed)
Stable, now living in a home away from his mother and has adjusted well,

## 2017-10-03 NOTE — Assessment & Plan Note (Signed)
Hyperlipidemia:Low fat diet discussed and encouraged.   Lipid Panel  Lab Results  Component Value Date   CHOL 186 05/03/2016   HDL 27 (L) 05/03/2016   LDLCALC 131 (H) 05/03/2016   TRIG 142 05/03/2016   CHOLHDL 6.9 (H) 05/03/2016   Updated lab needed at/ before next visit. Needs to reduce fat in diet to reduce heart disease risk, and commit to more regular exercise

## 2017-10-03 NOTE — Assessment & Plan Note (Signed)
Deteriorated. Patient re-educated about  the importance of commitment to a  minimum of 150 minutes of exercise per week.  The importance of healthy food choices with portion control discussed. Encouraged to start a food diary, count calories and to consider  joining a support group. Sample diet sheets offered. Goals set by the patient for the next several months.   Weight /BMI 09/24/2017 04/08/2017 03/05/2017  WEIGHT 217 lb 210 lb 213 lb  HEIGHT 5\' 10"  5\' 7"  5\' 7"   BMI 31.14 kg/m2 32.89 kg/m2 33.36 kg/m2

## 2017-10-03 NOTE — Assessment & Plan Note (Signed)
Controlled and stable on medication, he is treated by psychiatry

## 2017-10-03 NOTE — Progress Notes (Signed)
**Note Gilbert-Identified via Obfuscation**    Gilbert Cohen     MRN: [id number]      DOB: 10-08-1980   HPI Mr. Gilbert Cohen is here for follow up and re-evaluation of chronic medical conditions,.   Since his last visit, he had successful bilateral ear irrigation  There are no new concerns.  There are no specific complaints  Relocated , and is  No longer living with his mother, now living in a home and has settled in well per his Mother who actually brought him in to be seen today ROS Autistic, history per Mother Denies recent fever or chills. Denies sinus pressure, nasal congestion Denies chest congestion, productive cough or wheezing. Denies  leg swelling Denies abdominal pain, nausea, vomiting,diarrhea or constipation.   . Denies joint pain, swelling and limitation in mobility. Denies headaches. Denies uncontrolled  depression, anxiety or insomnia. Denies skin break down or rash.   PE  BP 112/74   Pulse 62   Resp 16   Ht 5\' 10"  (1.778 m)   Wt 217 lb (98.4 kg)   SpO2 96%   BMI 31.14 kg/m   Patient alert and in no cardiopulmonary distress.  HEENT: No facial asymmetry, EOMI,   oropharynx pink and moist.  Neck supple no JVD, no mass.  Chest: Clear to auscultation bilaterally.  CVS: S1, S2 no murmurs, no S3.Regular rate.  ABD: Soft non tender.   Ext: No edema  MS: Adequate ROM spine, shoulders, hips and knees.  Skin: Intact, no ulcerations or rash noted.  .  CNS: , power,  normal throughout.no focal deficits noted.   Assessment & PLAN  Autism spectrum disorder Stable, now living in a home away from his mother and has adjusted well,  Depression Controlled and stable on medication, he is treated by psychiatry  Dyslipidemia Hyperlipidemia:Low fat diet discussed and encouraged.   Lipid Panel  Lab Results  Component Value Date   CHOL 186 05/03/2016   HDL 27 (L) 05/03/2016   LDLCALC 131 (H) 05/03/2016   TRIG 142 05/03/2016   CHOLHDL 6.9 (H) 05/03/2016   Updated lab needed at/ before next  visit. Needs to reduce fat in diet to reduce heart disease risk, and commit to more regular exercise    Obesity (BMI 30.0-34.9) Deteriorated. Patient re-educated about  the importance of commitment to a  minimum of 150 minutes of exercise per week.  The importance of healthy food choices with portion control discussed. Encouraged to start a food diary, count calories and to consider  joining a support group. Sample diet sheets offered. Goals set by the patient for the next several months.   Weight /BMI 09/24/2017 04/08/2017 03/05/2017  WEIGHT 217 lb 210 lb 213 lb  HEIGHT 5\' 10"  5\' 7"  5\' 7"   BMI 31.14 kg/m2 32.89 kg/m2 33.36 kg/m2      Developmental disability Incapable of independent living , living in a supervised situation for the first time away from his Mother, and has adjusted well

## 2017-10-13 ENCOUNTER — Telehealth: Payer: Self-pay | Admitting: Family Medicine

## 2017-10-13 ENCOUNTER — Ambulatory Visit: Payer: Medicare Other

## 2017-10-13 DIAGNOSIS — Z111 Encounter for screening for respiratory tuberculosis: Secondary | ICD-10-CM | POA: Diagnosis not present

## 2017-10-13 NOTE — Telephone Encounter (Signed)
[facility]  °[phone] ° °Please fax copy of TB test. °Group home lost their copy. °

## 2017-10-15 ENCOUNTER — Ambulatory Visit (HOSPITAL_COMMUNITY)
Admission: RE | Admit: 2017-10-15 | Discharge: 2017-10-15 | Disposition: A | Discharge status: Home / Self Care | Payer: Medicare Other | Source: Ambulatory Visit | Attending: Family Medicine | Admitting: Family Medicine

## 2017-10-15 ENCOUNTER — Ambulatory Visit (INDEPENDENT_AMBULATORY_CARE_PROVIDER_SITE_OTHER): Payer: Medicare Other

## 2017-10-15 DIAGNOSIS — R7611 Nonspecific reaction to tuberculin skin test without active tuberculosis: Secondary | ICD-10-CM | POA: Diagnosis not present

## 2017-10-15 DIAGNOSIS — Z111 Encounter for screening for respiratory tuberculosis: Secondary | ICD-10-CM

## 2017-10-15 LAB — TB SKIN TEST: TB Skin Test: POSITIVE

## 2017-10-16 NOTE — Progress Notes (Signed)
Patient came in for PPD reading- it was positive at 18 mm. This was double checked by Dr. Delton SeeNelson and a chest x-ray was ordered.

## 2017-10-16 NOTE — Telephone Encounter (Signed)
faxed

## 2017-11-11 ENCOUNTER — Telehealth: Payer: Self-pay

## 2017-11-11 ENCOUNTER — Other Ambulatory Visit: Payer: Self-pay | Admitting: Family Medicine

## 2017-11-11 DIAGNOSIS — R7611 Nonspecific reaction to tuberculin skin test without active tuberculosis: Secondary | ICD-10-CM

## 2017-11-11 NOTE — Telephone Encounter (Signed)
Please call Dr Juanetta Gosling office and arrang a consultation appointment for this patient to be velauated in the office bydr hawkuins so that this can be  management asap. I called several times earlier this week to try to speak with dr Juanetta Gosling and was unable to, it is very important that the pt see hiim, he has no symptoms, burt he dOES live in a group  Home. Thank you, please also send a copy of the CXR. He has a positive PPD test

## 2017-11-11 NOTE — Telephone Encounter (Signed)
TB test, positive, sent to [facility] for Chest xray, and never heard anything #[phone]--and fax [phone]

## 2017-11-12 NOTE — Telephone Encounter (Signed)
When the call is answered today ALL THAT I nEED DONE is only an appointment MADE for the patient to see Dr Juanetta Gosling, PLEASE as soon as possible, I do not need to discuss the case anymore as I am asking Dr Juanetta Gosling to manage the patient

## 2017-11-12 NOTE — Telephone Encounter (Signed)
Called Dr.Hawkins office 2x, no answer, I left a voicemail and I faxed over a note letting them know you have been trying to reach Dr.Hawkins, and this was urgent. Will call back to followup at 9

## 2017-12-08 ENCOUNTER — Institutional Professional Consult (permissible substitution): Payer: Self-pay | Admitting: Pulmonary Disease

## 2018-01-05 ENCOUNTER — Telehealth: Payer: Self-pay | Admitting: Pulmonary Disease

## 2018-01-05 NOTE — Telephone Encounter (Signed)
Spoke to EdgarAshley with Adult Care Licensure with the state, who had questions regarding pt's apt and dx. Morrie Sheldonshley stated that she was made aware that our office had rescheduled pt initial visit and rescheduled for 01/13/18. Per our records pt no showed on 12/08/17 with pending ov on 01/13/18. Morrie Sheldonshley stated pt is currently in a family care home.  Morrie Sheldonshley also requested results of CXR performed on 10/15/17 by PCP.  I have provided Morrie Sheldonshley with medical records number to obtain records.  Nothing further is needed.

## 2018-01-11 ENCOUNTER — Ambulatory Visit: Payer: Self-pay

## 2018-01-13 ENCOUNTER — Other Ambulatory Visit (INDEPENDENT_AMBULATORY_CARE_PROVIDER_SITE_OTHER): Payer: Medicare Other

## 2018-01-13 ENCOUNTER — Ambulatory Visit (INDEPENDENT_AMBULATORY_CARE_PROVIDER_SITE_OTHER)
Admission: RE | Admit: 2018-01-13 | Discharge: 2018-01-13 | Disposition: A | Discharge status: Home / Self Care | Payer: Medicare Other | Source: Ambulatory Visit | Attending: Pulmonary Disease | Admitting: Pulmonary Disease

## 2018-01-13 ENCOUNTER — Ambulatory Visit (INDEPENDENT_AMBULATORY_CARE_PROVIDER_SITE_OTHER): Payer: Medicare Other | Admitting: Pulmonary Disease

## 2018-01-13 ENCOUNTER — Encounter: Payer: Self-pay | Admitting: Pulmonary Disease

## 2018-01-13 VITALS — BP 120/68 | HR 73 | Ht 70.0 in | Wt 211.6 lb

## 2018-01-13 DIAGNOSIS — R7611 Nonspecific reaction to tuberculin skin test without active tuberculosis: Secondary | ICD-10-CM

## 2018-01-13 LAB — COMPREHENSIVE METABOLIC PANEL
ALBUMIN: 4.7 g/dL (ref 3.5–5.2)
ALK PHOS: 51 U/L (ref 39–117)
ALT: 27 U/L (ref 0–53)
AST: 20 U/L (ref 0–37)
BILIRUBIN TOTAL: 1.1 mg/dL (ref 0.2–1.2)
BUN: 12 mg/dL (ref 6–23)
CALCIUM: 9.6 mg/dL (ref 8.4–10.5)
CO2: 31 mEq/L (ref 19–32)
Chloride: 105 mEq/L (ref 96–112)
Creatinine, Ser: 1.12 mg/dL (ref 0.40–1.50)
GFR: 94.9 mL/min (ref >60.00)
GLUCOSE: 107 mg/dL — AB (ref 70–99)
Potassium: 4.1 mEq/L (ref 3.5–5.1)
Sodium: 142 mEq/L (ref 135–145)
TOTAL PROTEIN: 7.5 g/dL (ref 6.0–8.3)

## 2018-01-13 LAB — CBC WITH DIFFERENTIAL/PLATELET
Basophils Absolute: 0 10*3/uL (ref 0.0–0.1)
Basophils Relative: 1.4 % (ref 0.0–3.0)
EOS ABS: 0.1 10*3/uL (ref 0.0–0.7)
Eosinophils Relative: 2.5 % (ref 0.0–5.0)
HCT: 46.2 % (ref 39.0–52.0)
HEMOGLOBIN: 15.3 g/dL (ref 13.0–17.0)
Lymphocytes Relative: 40.1 % (ref 12.0–46.0)
Lymphs Abs: 1.3 10*3/uL (ref 0.7–4.0)
MCHC: 33.1 g/dL (ref 30.0–36.0)
MCV: 81.5 fl (ref 78.0–100.0)
MONO ABS: 0.3 10*3/uL (ref 0.1–1.0)
MONOS PCT: 10.1 % (ref 3.0–12.0)
Neutro Abs: 1.5 10*3/uL (ref 1.4–7.7)
Neutrophils Relative %: 45.9 % (ref 43.0–77.0)
Platelets: 195 10*3/uL (ref 150.0–400.0)
RBC: 5.66 Mil/uL (ref 4.22–5.81)
RDW: 14.5 % (ref 11.5–15.5)
WBC: 3.3 10*3/uL — ABNORMAL LOW (ref 4.0–10.5)

## 2018-01-13 NOTE — Progress Notes (Signed)
Gilbert Cohen    [id number]    May 23, 1981  Primary Care Physician:Simpson, Milus Mallick, MD  Referring Physician: Kerri Perches, MD [address] [place], [place] [address]  Chief complaint: Positive PPD  HPI: 37 year old with history of autism, obesity, lives in a group home He had a PPD screening test done at his living facility.  The first test in January 2019 was negative at 0 mm.  The second test on April 2019 showed an 18 mm induration.  Apparently the policy of the group home is to get two PPD tests a few months apart. He had a chest x-ray done which was negative for any acute process.  Referred here for further evaluation.  Denies any cough, sputum production, fevers, chills, loss of weight, loss of appetite He was never incarcerated or homeless.  No evidence of immunodeficiency, HIV. No sick contacts with TB, no foreign travel No record of BCG administration.  Outpatient Encounter Medications as of 01/13/2018  Medication Sig  . citalopram (CELEXA) 40 MG tablet Take 40 mg by mouth every morning.   . [DISCONTINUED] ARIPiprazole (ABILIFY) 2 MG tablet Take 1 tablet (2 mg total) by mouth daily.  . [DISCONTINUED] hydrOXYzine (ATARAX/VISTARIL) 25 MG tablet Take 1 tablet (25 mg total) by mouth 3 (three) times daily.   No facility-administered encounter medications on file as of 01/13/2018.     Allergies as of 01/13/2018  . (No Known Allergies)    Past Medical History:  Diagnosis Date  . Autistic spectrum disorder   . Hyperlipidemia   . Intellectual disability   . Mental retardation 1982   hypoxic brain injury at birth    No past surgical history on file.  Family History  Problem Relation Age of Onset  . Hypertension Mother   . Cancer Sister 64       localized breast cancer in 2014    Social History   Socioeconomic History  . Marital status: Single    Spouse name: Not on file  . Number of children: Not on file  . Years of  education: Not on file  . Highest education level: Not on file  Occupational History  . Not on file  Social Needs  . Financial resource strain: Not on file  . Food insecurity:    Worry: Not on file    Inability: Not on file  . Transportation needs:    Medical: Not on file    Non-medical: Not on file  Tobacco Use  . Smoking status: Never Smoker  . Smokeless tobacco: Never Used  Substance and Sexual Activity  . Alcohol use: No  . Drug use: No  . Sexual activity: Not Currently  Lifestyle  . Physical activity:    Days per week: Not on file    Minutes per session: Not on file  . Stress: Not on file  Relationships  . Social connections:    Talks on phone: Not on file    Gets together: Not on file    Attends religious service: Not on file    Active member of club or organization: Not on file    Attends meetings of clubs or organizations: Not on file    Relationship status: Not on file  . Intimate partner violence:    Fear of current or ex partner: Not on file    Emotionally abused: Not on file    Physically abused: Not on file    Forced  sexual activity: Not on file  Other Topics Concern  . Not on file  Social History Narrative  . Not on file   Review of systems: Review of Systems  Constitutional: Negative for fever and chills.  HENT: Negative.   Eyes: Negative for blurred vision.  Respiratory: as per HPI  Cardiovascular: Negative for chest pain and palpitations.  Gastrointestinal: Negative for vomiting, diarrhea, blood per rectum. Genitourinary: Negative for dysuria, urgency, frequency and hematuria.  Musculoskeletal: Negative for myalgias, back pain and joint pain.  Skin: Negative for itching and rash.  Neurological: Negative for dizziness, tremors, focal weakness, seizures and loss of consciousness.  Endo/Heme/Allergies: Negative for environmental allergies.  Psychiatric/Behavioral: Negative for depression, suicidal ideas and hallucinations.  All other systems  reviewed and are negative.  Physical Exam: Blood pressure 120/68, pulse 73, height 5\' 10"  (1.778 m), weight 211 lb 9.6 oz (96 kg), SpO2 97 %. Gen:      No acute distress HEENT:  EOMI, sclera anicteric Neck:     No masses; no thyromegaly Lungs:    Clear to auscultation bilaterally; normal respiratory effort CV:         Regular rate and rhythm; no murmurs Abd:      + bowel sounds; soft, non-tender; no palpable masses, no distension Ext:    No edema; adequate peripheral perfusion Skin:      Warm and dry; no rash Neuro: alert and oriented x 3 Psych: normal mood and affect  Data Reviewed: Chest x-ray 10/15/17-no acute cardiopulmonary disease.  I have reviewed the images personally.  PPD 07/15/2017-0 mm 10/16/2017-18 mm  Assessment:  Positive PPD There is no evidence of active TB infection.  I am not convinced that he has latent TB as his first PPD test 4 months prior was negative. We will reevaluate with a chest x-ray, QuantiFERON gold test. If the QuantiFERON test is positive then we may consider treating him with rifampin for 4 months. Check comprehensive metabolic panel, CBC  Plan/Recommendations: - Repeat chest x-ray, QuantiFERON gold test - Comprehensive metabolic panel, CBC   Chilton GreathousePraveen Annmarie Plemmons MD [facility] 01/13/2018, 11:43 AM  CC: Kerri PerchesSimpson, Margaret E, MD

## 2018-01-13 NOTE — Patient Instructions (Signed)
We will get a comprehensive metabolic panel, CBC with differential and a repeat chest x-ray We will check a QuantiFERON gold test today Based on these results we may need to treat you for something called latent TB infection I will see you back in clinic in 2 to 4 weeks to review these tests and plan for next steps.

## 2018-01-16 LAB — QUANTIFERON-TB GOLD PLUS
Mitogen-NIL: >10 IU/mL
NIL: 0.02 IU/mL
QUANTIFERON-TB GOLD PLUS: NEGATIVE
TB1-NIL: 0 [IU]/mL
TB2-NIL: 0 [IU]/mL

## 2018-01-21 ENCOUNTER — Ambulatory Visit: Payer: Self-pay

## 2018-02-10 ENCOUNTER — Ambulatory Visit (INDEPENDENT_AMBULATORY_CARE_PROVIDER_SITE_OTHER): Payer: Medicare Other | Admitting: Pulmonary Disease

## 2018-02-10 ENCOUNTER — Encounter: Payer: Self-pay | Admitting: Pulmonary Disease

## 2018-02-10 VITALS — BP 120/70 | HR 77 | Ht 70.0 in | Wt 208.8 lb

## 2018-02-10 DIAGNOSIS — R7611 Nonspecific reaction to tuberculin skin test without active tuberculosis: Secondary | ICD-10-CM

## 2018-02-10 NOTE — Progress Notes (Signed)
Gilbert Cohen    [id number]    [birth year]  Primary Care Physician:Simpson, Milus Mallick, MD  Referring Physician: Kerri Perches, MD [address] [place], [place] [address]  Chief complaint: Positive PPD  HPI: 37 year old with history of autism, obesity, lives in a group home He had a PPD screening test done at his living facility.  The first test in January 2019 was negative at 0 mm.  The second test on April 2019 showed an 18 mm induration.  Apparently the policy of the group home is to get two PPD tests a few months apart. He had a chest x-ray done which was negative for any acute process.  Referred here for further evaluation.  Denies any cough, sputum production, fevers, chills, loss of weight, loss of appetite He was never incarcerated or homeless.  No evidence of immunodeficiency, HIV. No sick contacts with TB, no foreign travel No record of BCG administration.  Interim history: Gilbert Cohen is here for follow-up after he got an x-ray and QuantiFERON gold test States that his breathing is doing well.  Denies any cough, sputum production, wheezing, mucus production, fevers, chills  Outpatient Encounter Medications as of 02/10/2018  Medication Sig  . citalopram (CELEXA) 40 MG tablet Take 40 mg by mouth every morning.    No facility-administered encounter medications on file as of 02/10/2018.     Allergies as of 02/10/2018  . (No Known Allergies)    Past Medical History:  Diagnosis Date  . Autistic spectrum disorder   . Hyperlipidemia   . Intellectual disability   . Mental retardation 1982   hypoxic brain injury at birth    No past surgical history on file.  Family History  Problem Relation Age of Onset  . Hypertension Mother   . Cancer Sister 30       localized breast cancer in 2014    Social History   Socioeconomic History  . Marital status: Single    Spouse name: Not on file  . Number of children: Not on file  . Years of education:  Not on file  . Highest education level: Not on file  Occupational History  . Not on file  Social Needs  . Financial resource strain: Not on file  . Food insecurity:    Worry: Not on file    Inability: Not on file  . Transportation needs:    Medical: Not on file    Non-medical: Not on file  Tobacco Use  . Smoking status: Never Smoker  . Smokeless tobacco: Never Used  Substance and Sexual Activity  . Alcohol use: No  . Drug use: No  . Sexual activity: Not Currently  Lifestyle  . Physical activity:    Days per week: Not on file    Minutes per session: Not on file  . Stress: Not on file  Relationships  . Social connections:    Talks on phone: Not on file    Gets together: Not on file    Attends religious service: Not on file    Active member of club or organization: Not on file    Attends meetings of clubs or organizations: Not on file    Relationship status: Not on file  . Intimate partner violence:    Fear of current or ex partner: Not on file    Emotionally abused: Not on file    Physically abused: Not on file    Forced sexual activity:  Not on file  Other Topics Concern  . Not on file  Social History Narrative  . Not on file   Review of systems: Review of Systems  Constitutional: Negative for fever and chills.  HENT: Negative.   Eyes: Negative for blurred vision.  Respiratory: as per HPI  Cardiovascular: Negative for chest pain and palpitations.  Gastrointestinal: Negative for vomiting, diarrhea, blood per rectum. Genitourinary: Negative for dysuria, urgency, frequency and hematuria.  Musculoskeletal: Negative for myalgias, back pain and joint pain.  Skin: Negative for itching and rash.  Neurological: Negative for dizziness, tremors, focal weakness, seizures and loss of consciousness.  Endo/Heme/Allergies: Negative for environmental allergies.  Psychiatric/Behavioral: Negative for depression, suicidal ideas and hallucinations.  All other systems reviewed and are  negative.  Physical Exam: Blood pressure 120/70, pulse 77, height 5\' 10"  (1.778 m), weight 208 lb 12.8 oz (94.7 kg), SpO2 99 %. Gen:      No acute distress HEENT:  EOMI, sclera anicteric Neck:     No masses; no thyromegaly Lungs:    Clear to auscultation bilaterally; normal respiratory effort CV:         Regular rate and rhythm; no murmurs Abd:      + bowel sounds; soft, non-tender; no palpable masses, no distension Ext:    No edema; adequate peripheral perfusion Skin:      Warm and dry; no rash Neuro: alert and oriented x 3 Psych: normal mood and affect  Data Reviewed: Chest x-ray 10/15/17-no acute cardiopulmonary disease.  Chest x-ray 01/13/2018-no acute cardiopulmonary disease I have reviewed the images personally.   PPD 07/15/2017-0 mm 10/16/2017-18 mm  QuantiFERON gold 04/15/2018-negative  Comprehensive metabolic panel, CBC 01/13/2018-WBC 3.3, rest of results within normal limits  Assessment:  Positive PPD Sent here for evaluation after a two-step PPD evaluation showed a positive result on the second test  He got a couple of x-rays which are normal and a QuantiFERON gold test which is negative I do not see any evidence of active or latent TB based on these results.  I do not believe he required any treatment  Follow-up in clinic as needed.  Plan/Recommendations: - Follow-up as needed.   Chilton GreathousePraveen Taylia Berber MD [facility] 02/10/2018, 9:35 AM  CC: Kerri PerchesSimpson, Margaret E, MD

## 2018-02-10 NOTE — Patient Instructions (Signed)
I have reviewed your chest x-ray and blood tests which are normal I do not believe he will required any treatment at this point Follow-up in clinic as needed.

## 2018-03-11 ENCOUNTER — Ambulatory Visit: Payer: Self-pay | Admitting: Family Medicine

## 2018-03-15 ENCOUNTER — Ambulatory Visit (INDEPENDENT_AMBULATORY_CARE_PROVIDER_SITE_OTHER): Payer: Medicare Other | Admitting: Family Medicine

## 2018-03-15 ENCOUNTER — Encounter: Payer: Self-pay | Admitting: Family Medicine

## 2018-03-15 ENCOUNTER — Other Ambulatory Visit: Payer: Self-pay

## 2018-03-15 VITALS — BP 110/70 | HR 78 | Resp 12 | Ht 67.0 in | Wt 210.0 lb

## 2018-03-15 DIAGNOSIS — F329 Major depressive disorder, single episode, unspecified: Secondary | ICD-10-CM

## 2018-03-15 DIAGNOSIS — F32A Depression, unspecified: Secondary | ICD-10-CM

## 2018-03-15 DIAGNOSIS — F89 Unspecified disorder of psychological development: Secondary | ICD-10-CM | POA: Diagnosis not present

## 2018-03-15 DIAGNOSIS — E669 Obesity, unspecified: Secondary | ICD-10-CM

## 2018-03-15 DIAGNOSIS — Z23 Encounter for immunization: Secondary | ICD-10-CM | POA: Diagnosis not present

## 2018-03-15 DIAGNOSIS — E785 Hyperlipidemia, unspecified: Secondary | ICD-10-CM | POA: Diagnosis not present

## 2018-03-15 NOTE — Assessment & Plan Note (Signed)
Deteriorated. Patient re-educated about  the importance of commitment to a  minimum of 150 minutes of exercise per week.  The importance of healthy food choices with portion control discussed. Encouraged to start a food diary, count calories and to consider  joining a support group. Sample diet sheets offered. Goals set by the patient for the next several months.   Weight /BMI 03/15/2018 02/10/2018 01/13/2018  WEIGHT 210 lb 0.6 oz 208 lb 12.8 oz 211 lb 9.6 oz  HEIGHT 5\' 7"  5\' 10"  5\' 10"   BMI 32.9 kg/m2 29.96 kg/m2 30.36 kg/m2

## 2018-03-15 NOTE — Assessment & Plan Note (Signed)
Hyperlipidemia:Low fat diet discussed and encouraged.   Lipid Panel  Lab Results  Component Value Date   CHOL 186 05/03/2016   HDL 27 (L) 05/03/2016   LDLCALC 131 (H) 05/03/2016   TRIG 142 05/03/2016   CHOLHDL 6.9 (H) 05/03/2016   nees to reduce fried and fatty foods

## 2018-03-15 NOTE — Assessment & Plan Note (Signed)
Stable adjusting gradually to his new living environment , brought in by his Mom today and not the new caregiver, she states that he is doing as wellas to be expected overall

## 2018-03-15 NOTE — Progress Notes (Signed)
   [name]     MRN: [id number]      DOB: 1981/03/29   HPI Gilbert Cohen is here for follow up and re-evaluation of chronic medical conditions, medication management and review of any available recent lab and radiology data.  Preventive health is updated, specifically  Immunization.   Questions or concerns regarding consultations or procedures which the PT has had in the interim are  Addressed.He was seen by Pulmonary and has no need for treatment for posiitive PPD sklin test , no active tB The PT denies any adverse reactions to current medications since the last visit.  There are no new concerns.  There are no specific complaints , states he is getting accustomed to his new environment but misses [place]  ROS Denies recent fever or chills. Denies sinus pressure, nasal congestion, ear pain or sore throat. Denies chest congestion, productive cough or wheezing. Denies chest pains, palpitations and leg swelling Denies abdominal pain, nausea, vomiting,diarrhea or constipation.   Denies dysuria, frequency, hesitancy or incontinence. Denies joint pain, swelling and limitation in mobility. Denies headaches, seizures, numbness, or tingling. Denies depression, anxiety or insomnia. Denies skin break down or rash.   PE  BP 110/70 (BP Location: Left Arm, Patient Position: Sitting, Cuff Size: Large)   Pulse 78   Resp 12   Ht 5\' 7"  (1.702 m)   Wt 210 lb 0.6 oz (95.3 kg)   SpO2 97% Comment: room air  BMI 32.90 kg/m   Patient alert and oriented and in no cardiopulmonary distress.  HEENT: No facial asymmetry, EOMI,   oropharynx pink and moist.  Neck supple no JVD, no mass.  Chest: Clear to auscultation bilaterally.  CVS: S1, S2 no murmurs, no S3.Regular rate.  ABD: Soft non tender.   Ext: No edema  MS: Adequate ROM spine, shoulders, hips and knees.  Skin: Intact, no ulcerations or rash noted.  Psych: Fair  eye contact, t not anxious or depressed appearing.  CNS: CN 2-12  intact, power,  normal throughout.no focal deficits noted.   Assessment & Plan  Developmental disability Stable adjusting gradually to his new living environment , brought in by his Mom today and not the new caregiver, she states that he is doing as wellas to be expected overall  Obesity (BMI 30.0-34.9) Deteriorated. Patient re-educated about  the importance of commitment to a  minimum of 150 minutes of exercise per week.  The importance of healthy food choices with portion control discussed. Encouraged to start a food diary, count calories and to consider  joining a support group. Sample diet sheets offered. Goals set by the patient for the next several months.   Weight /BMI 03/15/2018 02/10/2018 01/13/2018  WEIGHT 210 lb 0.6 oz 208 lb 12.8 oz 211 lb 9.6 oz  HEIGHT 5\' 7"  5\' 10"  5\' 10"   BMI 32.9 kg/m2 29.96 kg/m2 30.36 kg/m2      Dyslipidemia Hyperlipidemia:Low fat diet discussed and encouraged.   Lipid Panel  Lab Results  Component Value Date   CHOL 186 05/03/2016   HDL 27 (L) 05/03/2016   LDLCALC 131 (H) 05/03/2016   TRIG 142 05/03/2016   CHOLHDL 6.9 (H) 05/03/2016   nees to reduce fried and fatty foods

## 2018-03-15 NOTE — Assessment & Plan Note (Signed)
Managed by psychiatry and controlled on medication

## 2018-03-15 NOTE — Patient Instructions (Addendum)
Wellness with nurse before year end , please schedule  MD follow up in with physical exam in 6 months, call if you need me before  Thank you  for choosing [facility]. We consider it a privelige to serve you.  Delivering excellent health care in a caring and  compassionate way is our goal.  Partnering with you,  so that together we can achieve this goal is our strategy.

## 2018-03-16 ENCOUNTER — Ambulatory Visit: Payer: Self-pay | Admitting: Family Medicine

## 2018-05-03 ENCOUNTER — Ambulatory Visit: Payer: Self-pay

## 2018-05-25 ENCOUNTER — Ambulatory Visit (INDEPENDENT_AMBULATORY_CARE_PROVIDER_SITE_OTHER): Payer: Medicare Other

## 2018-05-25 VITALS — BP 108/70 | HR 68 | Resp 12 | Ht 67.0 in | Wt 209.0 lb

## 2018-05-25 DIAGNOSIS — Z0000 Encounter for general adult medical examination without abnormal findings: Secondary | ICD-10-CM

## 2018-05-25 NOTE — Progress Notes (Signed)
**Note Gilbert-Identified via Obfuscation** Subjective:   Gilbert Cohen is a 37 y.o. male who presents for Medicare Annual/Subsequent preventive examination.  Review of Systems:   Cardiac Risk Factors include: obesity (BMI >30kg/m2);male gender     Objective:    Vitals: BP 108/70   Pulse 68   Resp 12   Ht 5\' 7"  (1.702 m)   Wt 209 lb (94.8 kg)   SpO2 96%   BMI 32.73 kg/m   Body mass index is 32.73 kg/m.  Advanced Directives 05/25/2018 04/08/2017 04/08/2017 03/05/2017  Does Patient Have a Medical Advance Directive? No No No No  Would patient like information on creating a medical advance directive? No - Patient declined No - Patient declined No - Patient declined -    Tobacco Social History   Tobacco Use  Smoking Status Never Smoker  Smokeless Tobacco Never Used     Counseling given: Not Answered   Clinical Intake:  Pre-visit preparation completed: Yes  Pain : No/denies pain Pain Score: 0-No pain     BMI - recorded: 32.7 Nutritional Status: BMI > 30  Obese Nutritional Risks: None Diabetes: No  How often do you need to have someone help you when you read instructions, pamphlets, or other written materials from your doctor or pharmacy?: 5 - Always What is the last grade level you completed in school?: 12 grade   Interpreter Needed?: No  Information entered by :: [place] PoliAnita Tequila Rottmann LPN   Past Medical History:  Diagnosis Date  . Autistic spectrum disorder   . Hyperlipidemia   . Intellectual disability   . Mental retardation 1982   hypoxic brain injury at birth   History reviewed. No pertinent surgical history. Family History  Problem Relation Age of Onset  . Hypertension Mother   . Cancer Sister [id number]       localized breast cancer in 2014   Social History   Socioeconomic History  . Marital status: Single    Spouse name: N/A  . Number of children: 0  . Years of education: 5012  . Highest education level: Not on file  Occupational History  . Occupation: disabled   Social Needs  . Financial  resource strain: Not hard at all  . Food insecurity:    Worry: Never true    Inability: Never true  . Transportation needs:    Medical: No    Non-medical: No  Tobacco Use  . Smoking status: Never Smoker  . Smokeless tobacco: Never Used  Substance and Sexual Activity  . Alcohol use: No  . Drug use: No  . Sexual activity: Not Currently  Lifestyle  . Physical activity:    Days per week: 0 days    Minutes per session: 0 min  . Stress: Not at all  Relationships  . Social connections:    Talks on phone: Never    Gets together: Once a week    Attends religious service: Never    Active member of club or organization: No    Attends meetings of clubs or organizations: Never    Relationship status: Never married  Other Topics Concern  . Not on file  Social History Narrative   Lives in a group home     Outpatient Encounter Medications as of 05/25/2018  Medication Sig  . citalopram (CELEXA) 40 MG tablet Take 40 mg by mouth every morning.    No facility-administered encounter medications on file as of 05/25/2018.     Activities of Daily Living In your present state of health, do  you have any difficulty performing the following activities: 05/25/2018  Hearing? N  Vision? N  Difficulty concentrating or making decisions? Y  Walking or climbing stairs? N  Dressing or bathing? N  Doing errands, shopping? Y  Preparing Food and eating ? Y  Using the Toilet? N  In the past six months, have you accidently leaked urine? N  Do you have problems with loss of bowel control? N  Managing your Medications? Y  Managing your Finances? Y  Housekeeping or managing your Housekeeping? Y  Some recent data might be hidden    Patient Care Team: Kerri Perches, MD as PCP - General (Family Medicine)   Assessment:   This is a routine wellness examination for Gilbert Cohen.  Exercise Activities and Dietary recommendations Current Exercise Habits: The patient does not participate in regular  exercise at present, Exercise limited by: psychological condition(s)  Goals    . Increase physical activity       Fall Risk Fall Risk  05/25/2018 03/15/2018 04/14/2014  Falls in the past year? 0 No No   Is the patient's home free of loose throw rugs in walkways, pet beds, electrical cords, etc?   no      Grab bars in the bathroom? yes      Handrails on the stairs?   yes      Adequate lighting?   yes  Timed Get Up and Go Performed: Patient able to perform in 5 seconds without assistance   Depression Screen PHQ 2/9 Scores 05/25/2018 03/15/2018 09/24/2017 09/08/2016  PHQ - 2 Score 0 - - -  Exception Documentation - Other- indicate reason in comment box Medical reason Other- indicate reason in comment box  Not completed - unable to complete - developmental disability     Cognitive Function     6CIT Screen 05/25/2018  What Year? 0 points  What month? 0 points  What time? 0 points  Count back from 20 0 points  Months in reverse 0 points  Repeat phrase 0 points  Total Score 0    Immunization History  Administered Date(s) Administered  . Influenza,inj,Quad PF,6+ Mos 05/18/2013, 04/14/2014, 04/24/2015, 05/07/2016, 05/14/2017, 03/15/2018  . PPD Test 07/13/2017, 10/13/2017  . Tdap 12/06/2012    Qualifies for Shingles Vaccine? N/A   Screening Tests Health Maintenance  Topic Date Due  . TETANUS/TDAP  12/07/2022  . INFLUENZA VACCINE  Completed  . HIV Screening  Completed   Cancer Screenings: Lung: Low Dose CT Chest recommended if Age 1-80 years, 30 pack-year currently smoking OR have quit w/in 15years. Patient does not qualify. Colorectal: N/A   Additional Screenings:  Hepatitis C Screening:N/A       Plan:   Patient living in group home, increase exercise  I have personally reviewed and noted the following in the patient's chart:   . Medical and social history . Use of alcohol, tobacco or illicit drugs  . Current medications and supplements . Functional ability and  status . Nutritional status . Physical activity . Advanced directives . List of other physicians . Hospitalizations, surgeries, and ER visits in previous 12 months . Vitals . Screenings to include cognitive, depression, and falls . Referrals and appointments  In addition, I have reviewed and discussed with patient certain preventive protocols, quality metrics, and best practice recommendations. A written personalized care plan for preventive services as well as general preventive health recommendations were provided to patient.     Fabian November, LPN  [id number]/84/1324

## 2018-05-25 NOTE — Patient Instructions (Signed)
Gilbert Cohen , Thank you for taking time to come for your Medicare Wellness Visit. I appreciate your ongoing commitment to your health goals. Please review the following plan we discussed and let me know if I can assist you in the future.   Screening recommendations/referrals: Colonoscopy: N/A  Recommended yearly ophthalmology/optometry visit for glaucoma screening and checkup Recommended yearly dental visit for hygiene and checkup  Vaccinations: Influenza vaccine: up to date  Pneumococcal vaccine: N/A  Tdap vaccine: up to date  Shingles vaccine: N/A     Advanced directives: no   Conditions/risks identified: Autism impaired cognitive ability   Next appointment: wellness visit in one year   Preventive Care 40-64 Years, Male Preventive care refers to lifestyle choices and visits with your health care provider that can promote health and wellness. What does preventive care include?  A yearly physical exam. This is also called an annual well check.  Dental exams once or twice a year.  Routine eye exams. Ask your health care provider how often you should have your eyes checked.  Personal lifestyle choices, including:  Daily care of your teeth and gums.  Regular physical activity.  Eating a healthy diet.  Avoiding tobacco and drug use.  Limiting alcohol use.  Practicing safe sex.  Taking low-dose aspirin every day starting at age 68. What happens during an annual well check? The services and screenings done by your health care provider during your annual well check will depend on your age, overall health, lifestyle risk factors, and family history of disease. Counseling  Your health care provider may ask you questions about your:  Alcohol use.  Tobacco use.  Drug use.  Emotional well-being.  Home and relationship well-being.  Sexual activity.  Eating habits.  Work and work Astronomer. Screening  You may have the following tests or measurements:  Height,  weight, and BMI.  Blood pressure.  Lipid and cholesterol levels. These may be checked every 5 years, or more frequently if you are over 51 years old.  Skin check.  Lung cancer screening. You may have this screening every year starting at age 67 if you have a 30-pack-year history of smoking and currently smoke or have quit within the past 15 years.  Fecal occult blood test (FOBT) of the stool. You may have this test every year starting at age 41.  Flexible sigmoidoscopy or colonoscopy. You may have a sigmoidoscopy every 5 years or a colonoscopy every 10 years starting at age 68.  Prostate cancer screening. Recommendations will vary depending on your family history and other risks.  Hepatitis C blood test.  Hepatitis B blood test.  Sexually transmitted disease (STD) testing.  Diabetes screening. This is done by checking your blood sugar (glucose) after you have not eaten for a while (fasting). You may have this done every 1-3 years. Discuss your test results, treatment options, and if necessary, the need for more tests with your health care provider. Vaccines  Your health care provider may recommend certain vaccines, such as:  Influenza vaccine. This is recommended every year.  Tetanus, diphtheria, and acellular pertussis (Tdap, Td) vaccine. You may need a Td booster every 10 years.  Zoster vaccine. You may need this after age 38.  Pneumococcal 13-valent conjugate (PCV13) vaccine. You may need this if you have certain conditions and have not been vaccinated.  Pneumococcal polysaccharide (PPSV23) vaccine. You may need one or two doses if you smoke cigarettes or if you have certain conditions. Talk to your health care provider  about which screenings and vaccines you need and how often you need them. This information is not intended to replace advice given to you by your health care provider. Make sure you discuss any questions you have with your health care provider. Document  Released: 07/20/2015 Document Revised: 03/12/2016 Document Reviewed: 04/24/2015 Elsevier Interactive Patient Education  2017 [employer].  Fall Prevention in the Home Falls can cause injuries. They can happen to people of all ages. There are many things you can do to make your home safe and to help prevent falls. What can I do on the outside of my home?  Regularly fix the edges of walkways and driveways and fix any cracks.  Remove anything that might make you trip as you walk through a door, such as a raised step or threshold.  Trim any bushes or trees on the path to your home.  Use bright outdoor lighting.  Clear any walking paths of anything that might make someone trip, such as rocks or tools.  Regularly check to see if handrails are loose or broken. Make sure that both sides of any steps have handrails.  Any raised decks and porches should have guardrails on the edges.  Have any leaves, snow, or ice cleared regularly.  Use sand or salt on walking paths during winter.  Clean up any spills in your garage right away. This includes oil or grease spills. What can I do in the bathroom?  Use night lights.  Install grab bars by the toilet and in the tub and shower. Do not use towel bars as grab bars.  Use non-skid mats or decals in the tub or shower.  If you need to sit down in the shower, use a plastic, non-slip stool.  Keep the floor dry. Clean up any water that spills on the floor as soon as it happens.  Remove soap buildup in the tub or shower regularly.  Attach bath mats securely with double-sided non-slip rug tape.  Do not have throw rugs and other things on the floor that can make you trip. What can I do in the bedroom?  Use night lights.  Make sure that you have a light by your bed that is easy to reach.  Do not use any sheets or blankets that are too big for your bed. They should not hang down onto the floor.  Have a firm chair that has side arms. You can use  this for support while you get dressed.  Do not have throw rugs and other things on the floor that can make you trip. What can I do in the kitchen?  Clean up any spills right away.  Avoid walking on wet floors.  Keep items that you use a lot in easy-to-reach places.  If you need to reach something above you, use a strong step stool that has a grab bar.  Keep electrical cords out of the way.  Do not use floor polish or wax that makes floors slippery. If you must use wax, use non-skid floor wax.  Do not have throw rugs and other things on the floor that can make you trip. What can I do with my stairs?  Do not leave any items on the stairs.  Make sure that there are handrails on both sides of the stairs and use them. Fix handrails that are broken or loose. Make sure that handrails are as long as the stairways.  Check any carpeting to make sure that it is firmly attached to  the stairs. Fix any carpet that is loose or worn.  Avoid having throw rugs at the top or bottom of the stairs. If you do have throw rugs, attach them to the floor with carpet tape.  Make sure that you have a light switch at the top of the stairs and the bottom of the stairs. If you do not have them, ask someone to add them for you. What else can I do to help prevent falls?  Wear shoes that:  Do not have high heels.  Have rubber bottoms.  Are comfortable and fit you well.  Are closed at the toe. Do not wear sandals.  If you use a stepladder:  Make sure that it is fully opened. Do not climb a closed stepladder.  Make sure that both sides of the stepladder are locked into place.  Ask someone to hold it for you, if possible.  Clearly mark and make sure that you can see:  Any grab bars or handrails.  First and last steps.  Where the edge of each step is.  Use tools that help you move around (mobility aids) if they are needed. These include:  Canes.  Walkers.  Scooters.  Crutches.  Turn on  the lights when you go into a dark area. Replace any light bulbs as soon as they burn out.  Set up your furniture so you have a clear path. Avoid moving your furniture around.  If any of your floors are uneven, fix them.  If there are any pets around you, be aware of where they are.  Review your medicines with your doctor. Some medicines can make you feel dizzy. This can increase your chance of falling. Ask your doctor what other things that you can do to help prevent falls. This information is not intended to replace advice given to you by your health care provider. Make sure you discuss any questions you have with your health care provider. Document Released: 04/19/2009 Document Revised: 11/29/2015 Document Reviewed: 07/28/2014 Elsevier Interactive Patient Education  2017 [employer]

## 2018-07-06 ENCOUNTER — Encounter: Payer: Self-pay | Admitting: *Deleted

## 2018-09-13 ENCOUNTER — Ambulatory Visit: Payer: Self-pay | Admitting: Family Medicine

## 2018-09-21 ENCOUNTER — Ambulatory Visit: Payer: Self-pay | Admitting: Family Medicine

## 2018-09-23 ENCOUNTER — Ambulatory Visit (INDEPENDENT_AMBULATORY_CARE_PROVIDER_SITE_OTHER): Payer: Self-pay | Admitting: Otolaryngology

## 2018-10-28 ENCOUNTER — Encounter: Payer: Self-pay | Admitting: *Deleted

## 2018-11-15 ENCOUNTER — Encounter: Payer: Medicare Other | Admitting: Family Medicine

## 2018-11-24 ENCOUNTER — Encounter: Payer: Self-pay | Admitting: Family Medicine

## 2018-12-08 ENCOUNTER — Encounter (INDEPENDENT_AMBULATORY_CARE_PROVIDER_SITE_OTHER): Payer: Self-pay

## 2018-12-08 ENCOUNTER — Ambulatory Visit (INDEPENDENT_AMBULATORY_CARE_PROVIDER_SITE_OTHER): Payer: Medicare Other | Admitting: Family Medicine

## 2018-12-08 ENCOUNTER — Other Ambulatory Visit: Payer: Self-pay

## 2018-12-08 ENCOUNTER — Encounter: Payer: Self-pay | Admitting: Family Medicine

## 2018-12-08 VITALS — BP 110/58 | HR 83 | Resp 12 | Ht 67.0 in | Wt 209.1 lb

## 2018-12-08 DIAGNOSIS — E785 Hyperlipidemia, unspecified: Secondary | ICD-10-CM | POA: Diagnosis not present

## 2018-12-08 DIAGNOSIS — F840 Autistic disorder: Secondary | ICD-10-CM

## 2018-12-08 DIAGNOSIS — F89 Unspecified disorder of psychological development: Secondary | ICD-10-CM

## 2018-12-08 DIAGNOSIS — E669 Obesity, unspecified: Secondary | ICD-10-CM

## 2018-12-08 DIAGNOSIS — F329 Major depressive disorder, single episode, unspecified: Secondary | ICD-10-CM

## 2018-12-08 DIAGNOSIS — F32A Depression, unspecified: Secondary | ICD-10-CM

## 2018-12-08 NOTE — Patient Instructions (Addendum)
F/U with MD in April 2021, call if you need me sooner  Keep wellness appointment with Nurse in November  As before  CBC,, chem 7 and  EGFR and TSH  And random lipid today  Social distancing. Frequent hand washing with soap and water Keeping your hands off of your face. These 3 practices will help to keep both you and your community healthy during this time. Please practice them faithfully!   Thanks for choosing [facility], we consider it a privelige to serve you.

## 2018-12-09 LAB — CBC
HCT: 45.3 % (ref 38.5–50.0)
Hemoglobin: 14.8 g/dL (ref 13.2–17.1)
MCH: 27.1 pg (ref 27.0–33.0)
MCHC: 32.7 g/dL (ref 32.0–36.0)
MCV: 83 fL (ref 80.0–100.0)
MPV: 12.2 fL (ref 7.5–12.5)
Platelets: 192 10*3/uL (ref 140–400)
RBC: 5.46 10*6/uL (ref 4.20–5.80)
RDW: 14.3 % (ref 11.0–15.0)
WBC: 3.6 10*3/uL — ABNORMAL LOW (ref 3.8–10.8)

## 2018-12-09 LAB — BASIC METABOLIC PANEL WITH GFR
BUN: 10 mg/dL (ref 7–25)
CO2: 29 mmol/L (ref 20–32)
Calcium: 9.4 mg/dL (ref 8.6–10.3)
Chloride: 105 mmol/L (ref 98–110)
Creat: 1.11 mg/dL (ref 0.60–1.35)
GFR, Est African American: 98 mL/min/{1.73_m2} (ref >=60)
GFR, Est Non African American: 84 mL/min/{1.73_m2} (ref >=60)
Glucose, Bld: 76 mg/dL (ref 65–139)
Potassium: 3.9 mmol/L (ref 3.5–5.3)
Sodium: 141 mmol/L (ref 135–146)

## 2018-12-10 ENCOUNTER — Encounter: Payer: Self-pay | Admitting: Family Medicine

## 2018-12-13 ENCOUNTER — Encounter: Payer: Self-pay | Admitting: Family Medicine

## 2018-12-13 NOTE — Assessment & Plan Note (Signed)
Treated by psychiatry, no behavioral disturbance noted by caregivers or family

## 2018-12-13 NOTE — Assessment & Plan Note (Signed)
unchanged Patient re-educated about  the importance of commitment to a  minimum of 150 minutes of exercise per week as able.  The importance of healthy food choices with portion control discussed, as well as eating regularly and within a 12 hour window most days. The need to choose "clean , green" food 50 to 75% of the time is discussed, as well as to make water the primary drink and set a goal of 64 ounces water daily.     Weight /BMI 12/08/2018 05/25/2018 03/15/2018  WEIGHT 209 lb 1.9 oz 209 lb 210 lb 0.6 oz  HEIGHT 5\' 7"  5\' 7"  5\' 7"   BMI 32.75 kg/m2 32.73 kg/m2 32.9 kg/m2

## 2018-12-13 NOTE — Progress Notes (Signed)
   Gilbert Cohen     MRN: [id number]      DOB: October 12, 1980   HPI Mr. Latorre is here for follow up and re-evaluation of chronic medical conditions, medication management and review of any available recent lab and radiology data. Will get lab today Preventive health is updated, specifically  Cancer screening and Immunization.   .  There are no new concerns.  There are no specific complaints , well settled in at his new home in [place], misses going out to work regulalrly  ROS Denies recent fever or chills. Denies sinus pressure, nasal congestion, ear pain or sore throat. Denies chest congestion, productive cough or wheezing. Denies chest pains, palpitations and leg swelling Denies abdominal pain, nausea, vomiting,diarrhea or constipation.   Denies dysuria, frequency, hesitancy or incontinence. Denies joint pain, swelling and limitation in mobility. Denies headaches, seizures, numbness, or tingling. Denies depression, anxiety or insomnia. Denies skin break down or rash.   PE  BP (!) 110/58   Pulse 83   Resp 12   Ht 5\' 7"  (1.702 m)   Wt 209 lb 1.9 oz (94.9 kg)   SpO2 96%   BMI 32.75 kg/m   Patient alert and oriented and in no cardiopulmonary distress.  HEENT: No facial asymmetry, EOMI,   oropharynx pink and moist.  Neck supple no JVD, no mass.  Chest: Clear to auscultation bilaterally.  CVS: S1, S2 no murmurs, no S3.Regular rate.  ABD: Soft non tender.   Ext: No edema  MS: Adequate ROM spine, shoulders, hips and knees.  Skin: Intact, no ulcerations or rash noted.  Psych: Good eye contact,  CNS: CN 2-12 intact, power,  normal throughout.no focal deficits noted.   Assessment & Plan  Autism spectrum disorder Stable, well adjusted to new living environment, no behavioral disturbance noted  Developmental disability Living in a group home environ, separate from his mother for approx 1 year, has adjusted very well, sees his Mother intermittently  Depression  Treated by psychiatry, no behavioral disturbance noted by caregivers or family  Dyslipidemia Hyperlipidemia:Low fat diet discussed and encouraged.   Lipid Panel  Lab Results  Component Value Date   CHOL 186 05/03/2016   HDL 27 (L) 05/03/2016   LDLCALC 131 (H) 05/03/2016   TRIG 142 05/03/2016   CHOLHDL 6.9 (H) 05/03/2016  Updated lab needed at/ before next visit. Needs to reduce fried and fatty foods     Obesity (BMI 30.0-34.9) unchanged Patient re-educated about  the importance of commitment to a  minimum of 150 minutes of exercise per week as able.  The importance of healthy food choices with portion control discussed, as well as eating regularly and within a 12 hour window most days. The need to choose "clean , green" food 50 to 75% of the time is discussed, as well as to make water the primary drink and set a goal of 64 ounces water daily.     Weight /BMI 12/08/2018 05/25/2018 03/15/2018  WEIGHT 209 lb 1.9 oz 209 lb 210 lb 0.6 oz  HEIGHT 5\' 7"  5\' 7"  5\' 7"   BMI 32.75 kg/m2 32.73 kg/m2 32.9 kg/m2

## 2018-12-13 NOTE — Assessment & Plan Note (Signed)
Living in a group home environ, separate from his mother for approx 1 year, has adjusted very well, sees his Mother intermittently

## 2018-12-13 NOTE — Assessment & Plan Note (Signed)
Stable, well adjusted to new living environment, no behavioral disturbance noted

## 2018-12-13 NOTE — Assessment & Plan Note (Signed)
Hyperlipidemia:Low fat diet discussed and encouraged.   Lipid Panel  Lab Results  Component Value Date   CHOL 186 05/03/2016   HDL 27 (L) 05/03/2016   LDLCALC 131 (H) 05/03/2016   TRIG 142 05/03/2016   CHOLHDL 6.9 (H) 05/03/2016  Updated lab needed at/ before next visit. Needs to reduce fried and fatty foods

## 2019-05-27 ENCOUNTER — Encounter: Payer: Self-pay | Admitting: Family Medicine

## 2019-05-27 ENCOUNTER — Ambulatory Visit (INDEPENDENT_AMBULATORY_CARE_PROVIDER_SITE_OTHER): Payer: Medicare Other | Admitting: Family Medicine

## 2019-05-27 ENCOUNTER — Other Ambulatory Visit: Payer: Self-pay

## 2019-05-27 VITALS — BP 110/58 | HR 83 | Resp 12 | Ht 67.0 in | Wt 209.0 lb

## 2019-05-27 DIAGNOSIS — Z0000 Encounter for general adult medical examination without abnormal findings: Secondary | ICD-10-CM | POA: Diagnosis not present

## 2019-05-27 NOTE — Progress Notes (Signed)
Subjective:   Gilbert Cohen is a 38 y.o. male who presents for Medicare Annual/Subsequent preventive examination.  Location of Patient: Home Location of Provider: Telehealth Consent was obtain for visit to be over via telehealth.  I verified that I am speaking with the correct person using two identifiers.   Review of Systems:    Cardiac Risk Factors include: obesity (BMI >30kg/m2);dyslipidemia;male gender     Objective:    Vitals: BP (!) 110/58   Pulse 83   Resp 12   Ht 5\' 7"  (1.702 m)   Wt 209 lb (94.8 kg)   BMI 32.73 kg/m   Body mass index is 32.73 kg/m.  Advanced Directives 05/25/2018 04/08/2017 04/08/2017 03/05/2017  Does Patient Have a Medical Advance Directive? No No No No  Would patient like information on creating a medical advance directive? No - Patient declined No - Patient declined No - Patient declined -    Tobacco Social History   Tobacco Use  Smoking Status Never Smoker  Smokeless Tobacco Never Used     Counseling given: Yes   Clinical Intake:  Pre-visit preparation completed: Yes  Pain : No/denies pain Pain Score: 0-No pain     BMI - recorded: 32.73 Nutritional Status: BMI > 30  Obese Nutritional Risks: None Diabetes: No  How often do you need to have someone help you when you read instructions, pamphlets, or other written materials from your doctor or pharmacy?: 4 - Often What is the last grade level you completed in school?: 12  Interpreter Needed?: No     Past Medical History:  Diagnosis Date  . Autistic spectrum disorder   . Hyperlipidemia   . Intellectual disability   . Mental retardation 1982   hypoxic brain injury at birth   History reviewed. No pertinent surgical history. Family History  Problem Relation Age of Onset  . Hypertension Mother   . Cancer Sister 45       localized breast cancer in 2014   Social History   Socioeconomic History  . Marital status: Single    Spouse name: N/A  . Number of children:  0  . Years of education: 58  . Highest education level: Not on file  Occupational History  . Occupation: disabled   Social Needs  . Financial resource strain: Not hard at all  . Food insecurity    Worry: Never true    Inability: Never true  . Transportation needs    Medical: No    Non-medical: No  Tobacco Use  . Smoking status: Never Smoker  . Smokeless tobacco: Never Used  Substance and Sexual Activity  . Alcohol use: No  . Drug use: No  . Sexual activity: Not Currently  Lifestyle  . Physical activity    Days per week: 0 days    Minutes per session: 0 min  . Stress: Not at all  Relationships  . Social 14 on phone: Never    Gets together: Once a week    Attends religious service: Never    Active member of club or organization: No    Attends meetings of clubs or organizations: Never    Relationship status: Never married  Other Topics Concern  . Not on file  Social History Narrative   Lives in a group home     Outpatient Encounter Medications as of 05/27/2019  Medication Sig  . citalopram (CELEXA) 40 MG tablet Take 40 mg by mouth every morning.    No  facility-administered encounter medications on file as of 05/27/2019.     Activities of Daily Living In your present state of health, do you have any difficulty performing the following activities: 05/27/2019  Hearing? N  Vision? N  Difficulty concentrating or making decisions? N  Walking or climbing stairs? N  Dressing or bathing? N  Doing errands, shopping? N  Preparing Food and eating ? N  Using the Toilet? N  In the past six months, have you accidently leaked urine? N  Do you have problems with loss of bowel control? N  Managing your Medications? Y  Managing your Finances? Y  Housekeeping or managing your Housekeeping? N  Some recent data might be hidden    Patient Care Team: Kerri PerchesSimpson, Margaret E, MD as PCP - General (Family Medicine)   Assessment:   This is a routine wellness  examination for [name].  Exercise Activities and Dietary recommendations Current Exercise Habits: Home exercise routine;The patient does not participate in regular exercise at present, Exercise limited by: None identified  Goals    . Increase physical activity       Fall Risk Fall Risk  05/27/2019 12/08/2018 05/25/2018 03/15/2018 04/14/2014  Falls in the past year? 0 0 0 No No  Number falls in past yr: 0 - - - -  Injury with Fall? 0 0 - - -   Is the patient's home free of loose throw rugs in walkways, pet beds, electrical cords, etc?   no      Grab bars in the bathroom? yes      Handrails on the stairs?   yes      Adequate lighting?   yes     Depression Screen PHQ 2/9 Scores 05/27/2019 12/08/2018 05/25/2018 03/15/2018  PHQ - 2 Score 0 0 0 -  Exception Documentation - - - Other- indicate reason in comment box  Not completed - - - unable to complete    Cognitive Function     6CIT Screen 05/27/2019 05/25/2018  What Year? 0 points 0 points  What month? 0 points 0 points  What time? 0 points 0 points  Count back from 20 0 points 0 points  Months in reverse 0 points 0 points  Repeat phrase 2 points 0 points  Total Score 2 0    Immunization History  Administered Date(s) Administered  . Influenza,inj,Quad PF,6+ Mos 05/18/2013, 04/14/2014, 04/24/2015, 05/07/2016, 05/14/2017, 03/15/2018  . PPD Test 07/13/2017, 10/13/2017  . Tdap 12/06/2012    Qualifies for Shingles Vaccine? n/a  Screening Tests Health Maintenance  Topic Date Due  . INFLUENZA VACCINE  02/05/2019  . TETANUS/TDAP  12/07/2022  . HIV Screening  Completed   Cancer Screenings: Lung: Low Dose CT Chest recommended if Age 70-80 years, 30 pack-year currently smoking OR have quit w/in 15years. Patient does not qualify. Colorectal: n/a  Additional Screenings:   Hepatitis C Screening: n/a     Plan:       1. Encounter for Medicare annual wellness exam   I have personally reviewed and noted the following in  the patient's chart:   . Medical and social history . Use of alcohol, tobacco or illicit drugs  . Current medications and supplements . Functional ability and status . Nutritional status . Physical activity . Advanced directives . List of other physicians . Hospitalizations, surgeries, and ER visits in previous 12 months . Vitals . Screenings to include cognitive, depression, and falls . Referrals and appointments  In addition, I have reviewed and discussed with  patient certain preventive protocols, quality metrics, and best practice recommendations. A written personalized care plan for preventive services as well as general preventive health recommendations were provided to patient.   I provided 20 minutes of non-face-to-face time during this encounter.   Perlie Mayo, NP  05/27/2019

## 2019-05-27 NOTE — Patient Instructions (Addendum)
Mr. Gilbert Cohen , Thank you for taking time to come for your Medicare Wellness Visit. I appreciate your ongoing commitment to your health goals. Please review the following plan we discussed and let me know if I can assist you in the future.   Please continue to practice social distancing to keep you, your family, and our community safe.  If you must go out, please wear a Mask and practice good handwashing.  We have that you have a safe, happy, healthy holiday season!   Screening recommendations/referrals: Colonoscopy: Not due yet  Recommended yearly ophthalmology/optometry visit for glaucoma screening and checkup Recommended yearly dental visit for hygiene and checkup  Vaccinations: Influenza vaccine: Needs appt  Pneumococcal vaccine: No due yet Tdap vaccine: Due 2024 Shingles vaccine: Not due yet    Advanced directives: no   Conditions/risks identified: Autism impaired cognitive ability  Next appointment: 10/12/2019  Preventive Care Years, Male Preventive care refers to lifestyle choices and visits with your health care provider that can promote health and wellness. What does preventive care include?  A yearly physical exam. This is also called an annual well check.  Dental exams once or twice a year.  Routine eye exams. Ask your health care provider how often you should have your eyes checked.  Personal lifestyle choices, including:  Daily care of your teeth and gums.  Regular physical activity.  Eating a healthy diet.  Avoiding tobacco and drug use.  Limiting alcohol use.  Practicing safe sex.  Taking low-dose aspirin every day starting at age 13. What happens during an annual well check? The services and screenings done by your health care provider during your annual well check will depend on your age, overall health, lifestyle risk factors, and family history of disease. Counseling  Your health care provider may ask you questions about your:  Alcohol use.   Tobacco use.  Drug use.  Emotional well-being.  Home and relationship well-being.  Sexual activity.  Eating habits.  Work and work Astronomer. Screening  You may have the following tests or measurements:  Height, weight, and BMI.  Blood pressure.  Lipid and cholesterol levels. These may be checked every 5 years, or more frequently if you are over 69 years old.  Skin check.  Lung cancer screening. You may have this screening every year starting at age 45 if you have a 30-pack-year history of smoking and currently smoke or have quit within the past 15 years.  Fecal occult blood test (FOBT) of the stool. You may have this test every year starting at age 59.  Flexible sigmoidoscopy or colonoscopy. You may have a sigmoidoscopy every 5 years or a colonoscopy every 10 years starting at age 22.  Prostate cancer screening. Recommendations will vary depending on your family history and other risks.  Hepatitis C blood test.  Hepatitis B blood test.  Sexually transmitted disease (STD) testing.  Diabetes screening. This is done by checking your blood sugar (glucose) after you have not eaten for a while (fasting). You may have this done every 1-3 years. Discuss your test results, treatment options, and if necessary, the need for more tests with your health care provider. Vaccines  Your health care provider may recommend certain vaccines, such as:  Influenza vaccine. This is recommended every year.  Tetanus, diphtheria, and acellular pertussis (Tdap, Td) vaccine. You may need a Td booster every 10 years.  Zoster vaccine. You may need this after age 21.  Pneumococcal 13-valent conjugate (PCV13) vaccine. You may need this if  you have certain conditions and have not been vaccinated.  Pneumococcal polysaccharide (PPSV23) vaccine. You may need one or two doses if you smoke cigarettes or if you have certain conditions. Talk to your health care provider about which screenings and  vaccines you need and how often you need them. This information is not intended to replace advice given to you by your health care provider. Make sure you discuss any questions you have with your health care provider. Document Released: 07/20/2015 Document Revised: 03/12/2016 Document Reviewed: 04/24/2015 Elsevier Interactive Patient Education  2017 [employer] Prevention in the Home Falls can cause injuries. They can happen to people of all ages. There are many things you can do to make your home safe and to help prevent falls. What can I do on the outside of my home?  Regularly fix the edges of walkways and driveways and fix any cracks.  Remove anything that might make you trip as you walk through a door, such as a raised step or threshold.  Trim any bushes or trees on the path to your home.  Use bright outdoor lighting.  Clear any walking paths of anything that might make someone trip, such as rocks or tools.  Regularly check to see if handrails are loose or broken. Make sure that both sides of any steps have handrails.  Any raised decks and porches should have guardrails on the edges.  Have any leaves, snow, or ice cleared regularly.  Use sand or salt on walking paths during winter.  Clean up any spills in your garage right away. This includes oil or grease spills. What can I do in the bathroom?  Use night lights.  Install grab bars by the toilet and in the tub and shower. Do not use towel bars as grab bars.  Use non-skid mats or decals in the tub or shower.  If you need to sit down in the shower, use a plastic, non-slip stool.  Keep the floor dry. Clean up any water that spills on the floor as soon as it happens.  Remove soap buildup in the tub or shower regularly.  Attach bath mats securely with double-sided non-slip rug tape.  Do not have throw rugs and other things on the floor that can make you trip. What can I do in the bedroom?  Use night lights.   Make sure that you have a light by your bed that is easy to reach.  Do not use any sheets or blankets that are too big for your bed. They should not hang down onto the floor.  Have a firm chair that has side arms. You can use this for support while you get dressed.  Do not have throw rugs and other things on the floor that can make you trip. What can I do in the kitchen?  Clean up any spills right away.  Avoid walking on wet floors.  Keep items that you use a lot in easy-to-reach places.  If you need to reach something above you, use a strong step stool that has a grab bar.  Keep electrical cords out of the way.  Do not use floor polish or wax that makes floors slippery. If you must use wax, use non-skid floor wax.  Do not have throw rugs and other things on the floor that can make you trip. What can I do with my stairs?  Do not leave any items on the stairs.  Make sure that there are handrails on both  sides of the stairs and use them. Fix handrails that are broken or loose. Make sure that handrails are as long as the stairways.  Check any carpeting to make sure that it is firmly attached to the stairs. Fix any carpet that is loose or worn.  Avoid having throw rugs at the top or bottom of the stairs. If you do have throw rugs, attach them to the floor with carpet tape.  Make sure that you have a light switch at the top of the stairs and the bottom of the stairs. If you do not have them, ask someone to add them for you. What else can I do to help prevent falls?  Wear shoes that:  Do not have high heels.  Have rubber bottoms.  Are comfortable and fit you well.  Are closed at the toe. Do not wear sandals.  If you use a stepladder:  Make sure that it is fully opened. Do not climb a closed stepladder.  Make sure that both sides of the stepladder are locked into place.  Ask someone to hold it for you, if possible.  Clearly mark and make sure that you can see:  Any  grab bars or handrails.  First and last steps.  Where the edge of each step is.  Use tools that help you move around (mobility aids) if they are needed. These include:  Canes.  Walkers.  Scooters.  Crutches.  Turn on the lights when you go into a dark area. Replace any light bulbs as soon as they burn out.  Set up your furniture so you have a clear path. Avoid moving your furniture around.  If any of your floors are uneven, fix them.  If there are any pets around you, be aware of where they are.  Review your medicines with your doctor. Some medicines can make you feel dizzy. This can increase your chance of falling. Ask your doctor what other things that you can do to help prevent falls. This information is not intended to replace advice given to you by your health care provider. Make sure you discuss any questions you have with your health care provider. Document Released: 04/19/2009 Document Revised: 11/29/2015 Document Reviewed: 07/28/2014 Elsevier Interactive Patient Education  2017 [employer].

## 2019-05-30 ENCOUNTER — Ambulatory Visit: Payer: Self-pay

## 2019-07-06 IMAGING — DX DG CHEST 2V
2 series · 2 of 2 positions shown · non-contrast
Comparison: None.

CLINICAL DATA: Asymptomatic positive PPD.

EXAM:
CHEST - 2 VIEW

[chest pa]
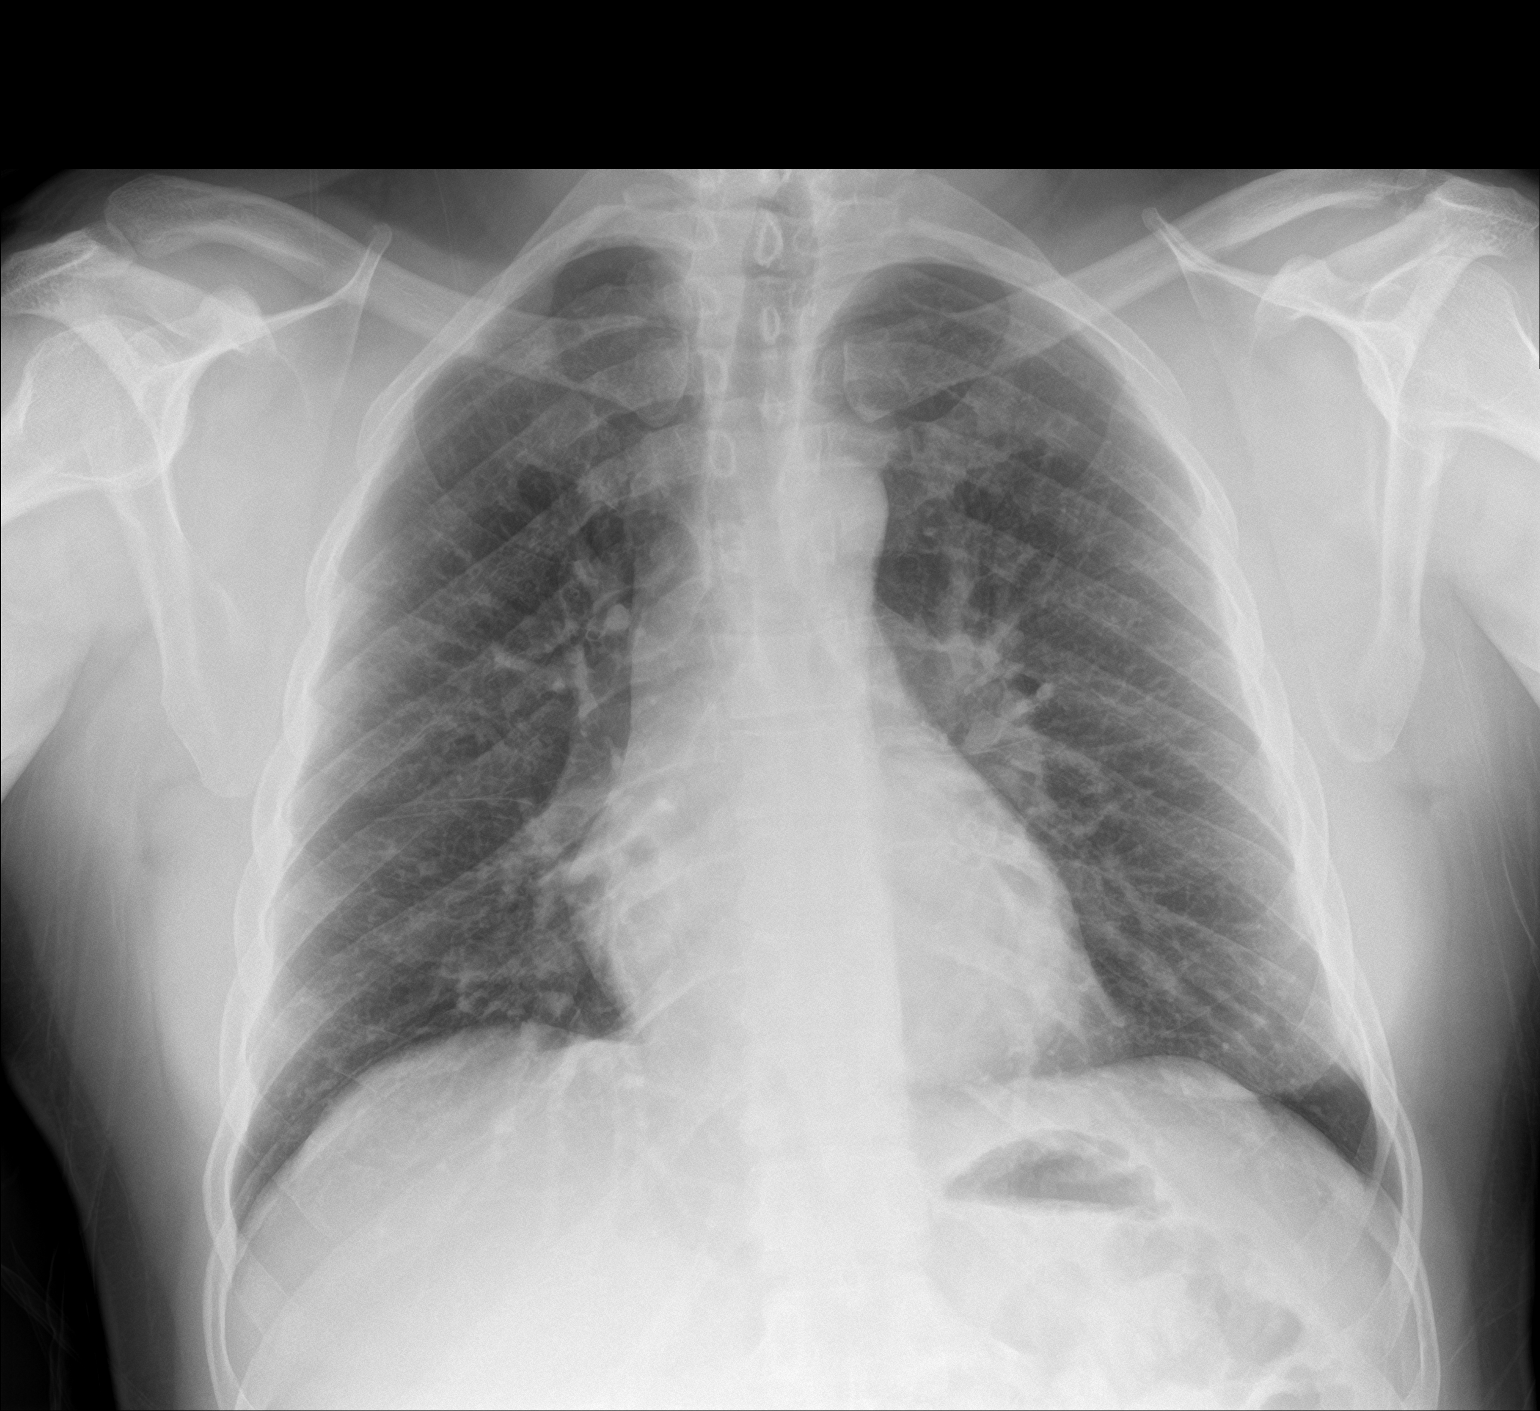

[chest lat]
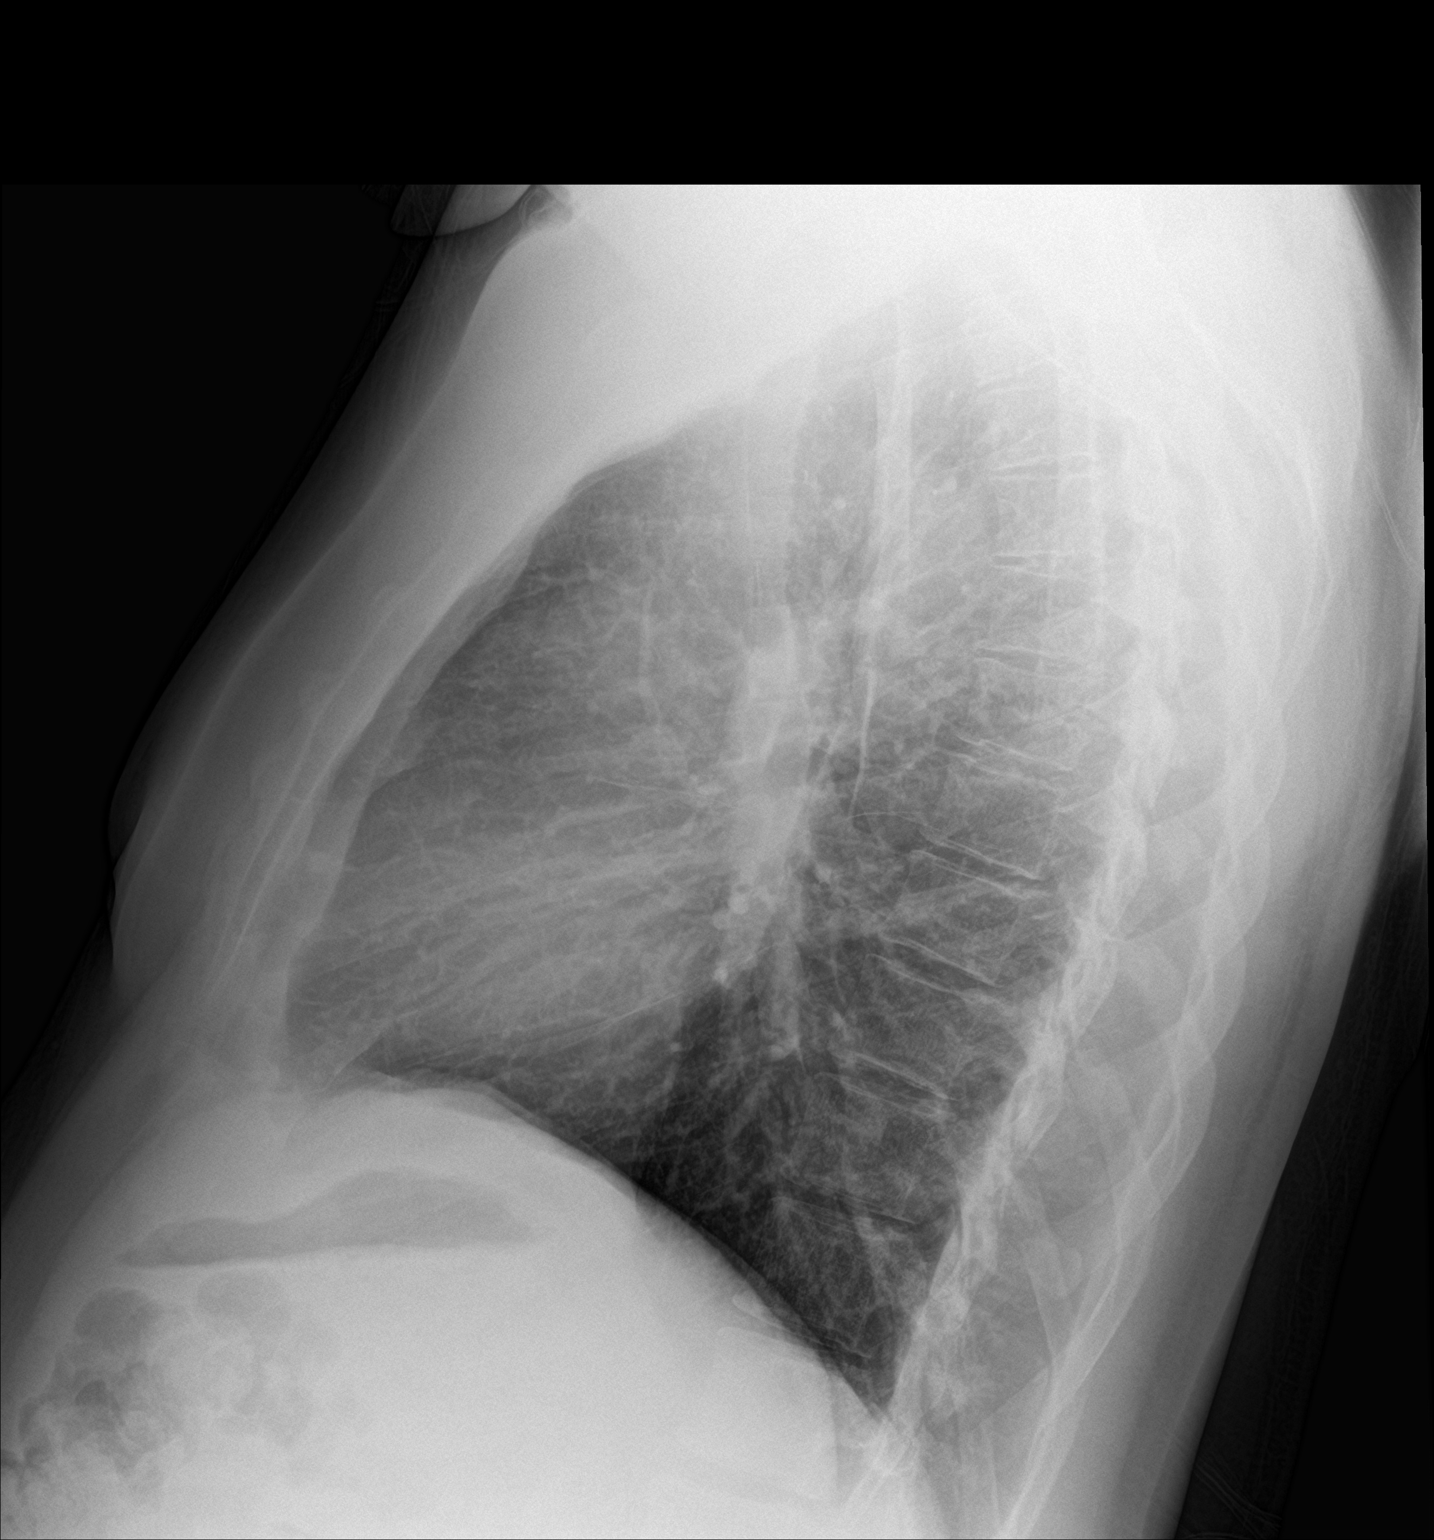

[2 of 2 positions shown; findings below may reference images not displayed]

FINDINGS: Cardiomediastinal silhouette unremarkable. Lungs clear.
Bronchovascular markings normal. Pulmonary vascularity normal. No
pneumothorax. No pleural effusions. Visualized bony thorax intact.
IMPRESSION: Normal examination.

## 2019-10-12 ENCOUNTER — Ambulatory Visit: Payer: Medicare Other | Admitting: Family Medicine

## 2019-10-18 ENCOUNTER — Other Ambulatory Visit: Payer: Self-pay

## 2019-10-18 ENCOUNTER — Ambulatory Visit (INDEPENDENT_AMBULATORY_CARE_PROVIDER_SITE_OTHER): Payer: Medicare Other | Admitting: Family Medicine

## 2019-10-18 VITALS — BP 110/58 | Ht 67.0 in | Wt 209.0 lb

## 2019-10-18 DIAGNOSIS — R7301 Impaired fasting glucose: Secondary | ICD-10-CM | POA: Diagnosis not present

## 2019-10-18 DIAGNOSIS — E669 Obesity, unspecified: Secondary | ICD-10-CM | POA: Diagnosis not present

## 2019-10-18 DIAGNOSIS — E785 Hyperlipidemia, unspecified: Secondary | ICD-10-CM | POA: Diagnosis not present

## 2019-10-18 DIAGNOSIS — Z6830 Body mass index (BMI) 30.0-30.9, adult: Secondary | ICD-10-CM

## 2019-10-18 NOTE — Progress Notes (Signed)
Virtual Visit via Telephone Note   This visit type was conducted due to national recommendations for restrictions regarding the COVID-19 Pandemic (e.g. social distancing) in an effort to limit this patient's exposure and mitigate transmission in our community.  Due to his co-morbid illnesses, this patient is at least at moderate risk for complications without adequate follow up.  This format is felt to be most appropriate for this patient at this time.  The patient did not have access to video technology/had technical difficulties with video requiring transitioning to audio format only (telephone).  All issues noted in this document were discussed and addressed.  No physical exam could be performed with this format.     Evaluation Performed:  Follow-up visit  Date:  10/19/2019   ID:  ZAIM NITTA, DOB 05-14-1981, MRN [id number]  Patient Location: Home Provider Location: Office  Location of Patient: Home Location of Provider: Telehealth Consent was obtain for visit to be over via telehealth. I verified that I am speaking with the correct person using two identifiers.  PCP:  Fayrene Helper, MD   Chief Complaint:  Follow up    History of Present Illness:    Gilbert Cohen is a 39 y.o. male with history of chronic hypoxia brain injury, obesity, depression, developmental disability, autism.  He denies having any issues or concerns today. Reports taking all his medications without any issues or concerns or side effects. Reports that he is eating and drinking without any issues. Denies having changes in bowel or bladder habits. Denies having any skin issues or signs of action. Denies having any history of falls recently. And reports that he is sleeping okay.  The patient does not have symptoms concerning for COVID-19 infection (fever, chills, cough, or new shortness of breath).   Past Medical, Surgical, Social History, Allergies, and Medications have been Reviewed.  Past  Medical History:  Diagnosis Date  . Autistic spectrum disorder   . Hyperlipidemia   . Intellectual disability   . Mental retardation 1982   hypoxic brain injury at birth   No past surgical history on file.   Current Meds  Medication Sig  . citalopram (CELEXA) 40 MG tablet Take 40 mg by mouth every morning.      Allergies:   Patient has no known allergies.   ROS:   Please see the history of present illness.    All other systems reviewed and are negative.   Labs/Other Tests and Data Reviewed:    Recent Labs: 12/08/2018: BUN 10; Creat 1.11; Hemoglobin 14.8; Platelets 192; Potassium 3.9; Sodium 141   Recent Lipid Panel Lab Results  Component Value Date/Time   CHOL 186 05/03/2016 08:19 AM   TRIG 142 05/03/2016 08:19 AM   HDL 27 (L) 05/03/2016 08:19 AM   CHOLHDL 6.9 (H) 05/03/2016 08:19 AM   LDLCALC 131 (H) 05/03/2016 08:19 AM    Wt Readings from Last 3 Encounters:  10/18/19 209 lb (94.8 kg)  05/27/19 209 lb (94.8 kg)  12/08/18 209 lb 1.9 oz (94.9 kg)     Objective:    Vital Signs:  BP (!) 110/58   Ht 5\' 7"  (1.702 m)   Wt 209 lb (94.8 kg)   BMI 32.73 kg/m    VITAL SIGNS:  reviewed GEN:  alert and oriented RESPIRATORY:  no shortness of breath noted in conversation  PSYCH:  normal affect and mood   ASSESSMENT & PLAN:    1. Dyslipidemia  - Lipid panel  2.  Obesity (BMI 30.0-34.9)  - CBC - COMPLETE METABOLIC PANEL WITH GFR  3. Impaired fasting glucose  - Hemoglobin A1c  Time:   Today, I have spent 15 minutes with the patient with telehealth technology discussing the above problems.     Medication Adjustments/Labs and Tests Ordered: Current medicines are reviewed at length with the patient today.  Concerns regarding medicines are outlined above.   Tests Ordered: Orders Placed This Encounter  Procedures  . CBC  . COMPLETE METABOLIC PANEL WITH GFR  . Hemoglobin A1c  . Lipid panel    Medication Changes: No orders of the defined types were placed  in this encounter.   Disposition:  Follow up  6 months  Signed, Freddy Finner, NP  10/19/2019 1:11 PM     [facility]

## 2019-10-19 ENCOUNTER — Encounter: Payer: Self-pay | Admitting: Family Medicine

## 2019-10-19 DIAGNOSIS — R7303 Prediabetes: Secondary | ICD-10-CM | POA: Insufficient documentation

## 2019-10-19 DIAGNOSIS — R7301 Impaired fasting glucose: Secondary | ICD-10-CM | POA: Insufficient documentation

## 2019-10-19 NOTE — Assessment & Plan Note (Signed)
  Gilbert Cohen is re-educated about the importance of exercise daily to help with weight management. A minumum of 30 minutes daily is recommended. Additionally, importance of healthy food choices  with portion control discussed.   Wt Readings from Last 3 Encounters:  10/18/19 209 lb (94.8 kg)  05/27/19 209 lb (94.8 kg)  12/08/18 209 lb 1.9 oz (94.9 kg)

## 2019-10-19 NOTE — Assessment & Plan Note (Signed)
Updated labs ordered. Encouraged heart healthy low sugar diet.

## 2019-10-19 NOTE — Patient Instructions (Signed)
I appreciate the opportunity to provide you with care for your health and wellness. Today we discussed: overall health   Follow up: 6 months with Dr Lodema Hong   Labs ordered today-get fasting in the next week  No referrals  Please continue to practice social distancing to keep you, your family, and our community safe.  If you must go out, please wear a mask and practice good handwashing.  It was a pleasure to see you and I look forward to continuing to work together on your health and well-being. Please do not hesitate to call the office if you need care or have questions about your care.  Have a wonderful day and week. With Gratitude, Tereasa Coop, DNP, AGNP-BC

## 2019-10-19 NOTE — Assessment & Plan Note (Signed)
Low-fat diet encouraged and discussed. Updated labs ordered

## 2019-11-23 LAB — COMPLETE METABOLIC PANEL WITH GFR
AG Ratio: 1.9 (calc) (ref 1.0–2.5)
ALT: 40 U/L (ref 9–46)
AST: 27 U/L (ref 10–40)
Albumin: 4.6 g/dL (ref 3.6–5.1)
Alkaline phosphatase (APISO): 44 U/L (ref 36–130)
BUN: 10 mg/dL (ref 7–25)
CO2: 31 mmol/L (ref 20–32)
Calcium: 9.4 mg/dL (ref 8.6–10.3)
Chloride: 103 mmol/L (ref 98–110)
Creat: 1.18 mg/dL (ref 0.60–1.35)
GFR, Est African American: 90 mL/min/{1.73_m2} (ref >=60)
GFR, Est Non African American: 78 mL/min/{1.73_m2} (ref >=60)
Globulin: 2.4 g/dL (calc) (ref 1.9–3.7)
Glucose, Bld: 94 mg/dL (ref 65–99)
Potassium: 4.8 mmol/L (ref 3.5–5.3)
Sodium: 139 mmol/L (ref 135–146)
Total Bilirubin: 0.8 mg/dL (ref 0.2–1.2)
Total Protein: 7 g/dL (ref 6.1–8.1)

## 2019-11-23 LAB — CBC
HCT: 48.5 % (ref 38.5–50.0)
Hemoglobin: 15.6 g/dL (ref 13.2–17.1)
MCH: 26.5 pg — ABNORMAL LOW (ref 27.0–33.0)
MCHC: 32.2 g/dL (ref 32.0–36.0)
MCV: 82.5 fL (ref 80.0–100.0)
MPV: 11.4 fL (ref 7.5–12.5)
Platelets: 197 10*3/uL (ref 140–400)
RBC: 5.88 10*6/uL — ABNORMAL HIGH (ref 4.20–5.80)
RDW: 13.7 % (ref 11.0–15.0)
WBC: 3 10*3/uL — ABNORMAL LOW (ref 3.8–10.8)

## 2019-11-23 LAB — LIPID PANEL
Cholesterol: 202 mg/dL — ABNORMAL HIGH (ref <200)
HDL: 33 mg/dL — ABNORMAL LOW (ref >=40)
LDL Cholesterol (Calc): 133 mg/dL (calc) — ABNORMAL HIGH
Non-HDL Cholesterol (Calc): 169 mg/dL (calc) — ABNORMAL HIGH (ref <130)
Total CHOL/HDL Ratio: 6.1 (calc) — ABNORMAL HIGH (ref <5.0)
Triglycerides: 222 mg/dL — ABNORMAL HIGH (ref <150)

## 2019-11-23 LAB — HEMOGLOBIN A1C
Hgb A1c MFr Bld: 5.7 % of total Hgb — ABNORMAL HIGH (ref <5.7)
Mean Plasma Glucose: 117 (calc)
eAG (mmol/L): 6.5 (calc)

## 2019-11-28 ENCOUNTER — Other Ambulatory Visit: Payer: Self-pay

## 2019-11-28 ENCOUNTER — Telehealth: Payer: Self-pay

## 2019-11-28 DIAGNOSIS — E785 Hyperlipidemia, unspecified: Secondary | ICD-10-CM

## 2019-11-28 MED ORDER — FISH OIL 1000 MG PO CAPS: ORAL_CAPSULE | ORAL | 3 refills | Status: AC

## 2019-11-28 NOTE — Telephone Encounter (Signed)
Facility aware and faxed orders to facility

## 2020-04-19 ENCOUNTER — Ambulatory Visit (INDEPENDENT_AMBULATORY_CARE_PROVIDER_SITE_OTHER): Payer: Medicare Other | Admitting: Family Medicine

## 2020-04-19 ENCOUNTER — Encounter: Payer: Self-pay | Admitting: Family Medicine

## 2020-04-19 ENCOUNTER — Other Ambulatory Visit: Payer: Self-pay

## 2020-04-19 VITALS — BP 136/80 | HR 84 | Temp 98.3°F | Resp 16 | Ht 67.0 in | Wt 234.0 lb

## 2020-04-19 DIAGNOSIS — I10 Essential (primary) hypertension: Secondary | ICD-10-CM

## 2020-04-19 DIAGNOSIS — R7301 Impaired fasting glucose: Secondary | ICD-10-CM | POA: Diagnosis not present

## 2020-04-19 DIAGNOSIS — Z23 Encounter for immunization: Secondary | ICD-10-CM

## 2020-04-19 DIAGNOSIS — F840 Autistic disorder: Secondary | ICD-10-CM

## 2020-04-19 DIAGNOSIS — E785 Hyperlipidemia, unspecified: Secondary | ICD-10-CM | POA: Diagnosis not present

## 2020-04-19 NOTE — Patient Instructions (Addendum)
F/U in office with MD in 6 months, call if you need me sooner  Flu vaccine today  Please get fasting lipid, chem 7 and eGFR, TSH and hBA1C in the next 1 to 2  Weeks  It is important that you exercise regularly at least 30 minutes 5 times a week. If you develop chest pain, have severe difficulty breathing, or feel very tired, stop exercising immediately and seek medical attention  Thanks for choosing [facility], we consider it a privelige to serve you.

## 2020-04-21 ENCOUNTER — Encounter: Payer: Self-pay | Admitting: Family Medicine

## 2020-04-21 NOTE — Assessment & Plan Note (Signed)
Controlled, no change in medication Managed by Psych 

## 2020-04-21 NOTE — Assessment & Plan Note (Signed)
Hyperlipidemia:Low fat diet discussed and encouraged.   Lipid Panel  Lab Results  Component Value Date   CHOL 202 (H) 11/22/2019   HDL 33 (L) 11/22/2019   LDLCALC 133 (H) 11/22/2019   TRIG 222 (H) 11/22/2019   CHOLHDL 6.1 (H) 11/22/2019     Updated lab needed at/ before next visit.

## 2020-04-21 NOTE — Progress Notes (Signed)
   Gilbert Cohen     MRN: [id number]      DOB: Mar 24, 1981   HPI Gilbert Cohen is here for follow up and re-evaluation of chronic medical conditions, medication management and review of any available recent lab and radiology data.  Preventive health is updated, specifically  Cancer screening and Immunization.    There are no new concerns.  There are no specific complaints  He is unaccompanied, no concerns have been relayed by his caregiver and he is autistic  ROS Denies recent fever or chills. Denies sinus pressure, nasal congestion, ear pain or sore throat. Denies chest congestion, productive cough or wheezing. Denies chest pains, palpitations and leg swelling Denies abdominal pain, nausea, vomiting,diarrhea or constipation.   Denies dysuria, frequency, hesitancy or incontinence. Denies joint pain, swelling and limitation in mobility. Denies headaches, seizures, numbness, or tingling. Denies uncontrolled depression, anxiety or insomnia. Denies skin break down or rash.   PE  BP 136/80   Pulse 84   Temp 98.3 F (36.8 C)   Resp 16   Ht 5\' 7"  (1.702 m)   Wt 234 lb (106.1 kg)   BMI 36.65 kg/m   Patient alert and oriented and in no cardiopulmonary distress.  HEENT: No facial asymmetry, EOMI,     Neck supple .  Chest: Clear to auscultation bilaterally.  CVS: S1, S2 no murmurs, no S3.Regular rate.  ABD: Soft non tender.   Ext: No edema  MS: Adequate ROM spine, shoulders, hips and knees.  Skin: Intact, no ulcerations or rash noted.  Psych: Good eye contact, normal affect. Memory intact not anxious or depressed appearing.  CNS: CN 2-12 intact, power,  normal throughout.no focal deficits noted.   Assessment & Plan  Dyslipidemia Hyperlipidemia:Low fat diet discussed and encouraged.   Lipid Panel  Lab Results  Component Value Date   CHOL 202 (H) 11/22/2019   HDL 33 (L) 11/22/2019   LDLCALC 133 (H) 11/22/2019   TRIG 222 (H) 11/22/2019   CHOLHDL 6.1 (H)  11/22/2019     Updated lab needed at/ before next visit.   Depression Controlled, no change in medication Managed by Psych  Autism spectrum disorder Lives in group home setting and has apparently settled in well, incapable of independent living

## 2020-04-21 NOTE — Assessment & Plan Note (Addendum)
Lives in group home setting and has apparently settled in well, incapable of independent living

## 2020-04-23 DIAGNOSIS — Z23 Encounter for immunization: Secondary | ICD-10-CM | POA: Diagnosis not present

## 2020-05-30 ENCOUNTER — Other Ambulatory Visit: Payer: Self-pay

## 2020-05-30 ENCOUNTER — Encounter: Payer: Medicare Other | Admitting: Nurse Practitioner

## 2020-05-30 NOTE — Progress Notes (Deleted)
Subjective:   Gilbert Cohen is a 39 y.o. male who presents for Medicare Annual/Subsequent preventive examination.        Objective:    There were no vitals filed for this visit. There is no height or weight on file to calculate BMI.  Advanced Directives 05/25/2018 04/08/2017 04/08/2017 03/05/2017  Does Patient Have a Medical Advance Directive? No No No No  Would patient like information on creating a medical advance directive? No - Patient declined No - Patient declined No - Patient declined -    Current Medications (verified) Outpatient Encounter Medications as of 05/30/2020  Medication Sig  . citalopram (CELEXA) 40 MG tablet Take 40 mg by mouth every morning.   . Omega-3 Fatty Acids (FISH OIL) 1000 MG CAPS Two daily   No facility-administered encounter medications on file as of 05/30/2020.    Allergies (verified) Patient has no known allergies.   History: Past Medical History:  Diagnosis Date  . Autistic spectrum disorder   . Hyperlipidemia   . Intellectual disability   . Mental retardation 1982   hypoxic brain injury at birth   No past surgical history on file. Family History  Problem Relation Age of Onset  . Hypertension Mother   . Cancer Sister 54       localized breast cancer in 2014   Social History   Socioeconomic History  . Marital status: Single    Spouse name: N/A  . Number of children: 0  . Years of education: 68  . Highest education level: Not on file  Occupational History  . Occupation: disabled   Tobacco Use  . Smoking status: Never Smoker  . Smokeless tobacco: Never Used  Vaping Use  . Vaping Use: Never used  Substance and Sexual Activity  . Alcohol use: No  . Drug use: No  . Sexual activity: Not Currently  Other Topics Concern  . Not on file  Social History Narrative   Lives in a group home    Social Determinants of Health   Financial Resource Strain:   . Difficulty of Paying Living Expenses: Not on file  Food  Insecurity:   . Worried About Programme researcher, broadcasting/film/video in the Last Year: Not on file  . Ran Out of Food in the Last Year: Not on file  Transportation Needs:   . Lack of Transportation (Medical): Not on file  . Lack of Transportation (Non-Medical): Not on file  Physical Activity:   . Days of Exercise per Week: Not on file  . Minutes of Exercise per Session: Not on file  Stress:   . Feeling of Stress : Not on file  Social Connections:   . Frequency of Communication with Friends and Family: Not on file  . Frequency of Social Gatherings with Friends and Family: Not on file  . Attends Religious Services: Not on file  . Active Member of Clubs or Organizations: Not on file  . Attends Banker Meetings: Not on file  . Marital Status: Not on file    Tobacco Counseling Counseling given: Not Answered                  Diabetic? No          Activities of Daily Living No flowsheet data found.  Patient Care Team: Kerri Perches, MD as PCP - General (Family Medicine)  Indicate any recent Medical Services you may have received from other than [facility] providers in the past year (date may  be approximate).     Assessment:   This is a routine wellness examination for [name].  Hearing/Vision screen No exam data present  Dietary issues and exercise activities discussed:    Goals    . Increase physical activity      Depression Screen PHQ 2/9 Scores 04/19/2020 05/27/2019 12/08/2018 05/25/2018 03/15/2018 09/24/2017 09/08/2016  PHQ - 2 Score 0 0 0 0 - - -  Exception Documentation - - - - Other- indicate reason in comment box Medical reason Other- indicate reason in comment box  Not completed - - - - unable to complete - developmental disability     Fall Risk Fall Risk  04/19/2020 10/18/2019 05/27/2019 12/08/2018 05/25/2018  Falls in the past year? 0 0 0 0 0  Number falls in past yr: 0 0 0 - -  Injury with Fall? 0 0 0 0 -  Follow up Falls evaluation completed - - - -     Any stairs in or around the home? {YES/NO:21197} If so, are there any without handrails? {YES/NO:21197} Home free of loose throw rugs in walkways, pet beds, electrical cords, etc? {YES/NO:21197} Adequate lighting in your home to reduce risk of falls? {YES/NO:21197}  ASSISTIVE DEVICES UTILIZED TO PREVENT FALLS:  Life alert? {YES/NO:21197} Use of a cane, walker or w/c? {YES/NO:21197} Grab bars in the bathroom? {YES/NO:21197} Shower chair or bench in shower? {YES/NO:21197} Elevated toilet seat or a handicapped toilet? {YES/NO:21197}  TIMED UP AND GO:  Was the test performed? {YES/NO:21197}.  Length of time to ambulate 10 feet: *** sec.   {Appearance of HALP:3790240}  Cognitive Function:     6CIT Screen 05/27/2019 05/25/2018  What Year? 0 points 0 points  What month? 0 points 0 points  What time? 0 points 0 points  Count back from 20 0 points 0 points  Months in reverse 0 points 0 points  Repeat phrase 2 points 0 points  Total Score 2 0    Immunizations Immunization History  Administered Date(s) Administered  . Influenza,inj,Quad PF,6+ Mos 05/18/2013, 04/14/2014, 04/24/2015, 05/07/2016, 05/14/2017, 03/15/2018, 04/23/2020  . PPD Test 07/13/2017, 10/13/2017  . Tdap 12/06/2012    {TDAP status:2101805} {Flu Vaccine status:2101806} {Pneumococcal vaccine status:2101807} {Covid-19 vaccine status:2101808}  Qualifies for Shingles Vaccine? {YES/NO:21197}  Zostavax completed {YES/NO:21197}  {Shingrix Completed?:2101804}  Screening Tests Health Maintenance  Topic Date Due  . Hepatitis C Screening  Never done  . COVID-19 Vaccine (1) Never done  . TETANUS/TDAP  12/07/2022  . INFLUENZA VACCINE  Completed  . HIV Screening  Completed    Health Maintenance  Health Maintenance Due  Topic Date Due  . Hepatitis C Screening  Never done  . COVID-19 Vaccine (1) Never done    {Colorectal cancer screening:2101809}  Lung Cancer Screening: (Low Dose CT Chest recommended if  Age 79-80 years, 30 pack-year currently smoking OR have quit w/in 15years.) {DOES NOT does:27190::"does not"} qualify.   Lung Cancer Screening Referral: ***  Additional Screening:  Hepatitis C Screening: {DOES NOT does:27190::"does not"} qualify; Completed ***  Vision Screening: Recommended annual ophthalmology exams for early detection of glaucoma and other disorders of the eye. Is the patient up to date with their annual eye exam?  {YES/NO:21197} Who is the provider or what is the name of the office in which the patient attends annual eye exams? *** If pt is not established with a provider, would they like to be referred to a provider to establish care? {YES/NO:21197}.   Dental Screening: Recommended annual dental exams for proper oral hygiene  Community Resource Referral / Chronic Care Management: CRR required this visit?  {YES/NO:21197}  CCM required this visit?  {YES/NO:21197}     Plan:     I have personally reviewed and noted the following in the patient's chart:   . Medical and social history . Use of alcohol, tobacco or illicit drugs  . Current medications and supplements . Functional ability and status . Nutritional status . Physical activity . Advanced directives . List of other physicians . Hospitalizations, surgeries, and ER visits in previous 12 months . Vitals . Screenings to include cognitive, depression, and falls . Referrals and appointments  In addition, I have reviewed and discussed with patient certain preventive protocols, quality metrics, and best practice recommendations. A written personalized care plan for preventive services as well as general preventive health recommendations were provided to patient.     Dellia Cloud, LPN   [id number]   Nurse Notes: ***

## 2020-06-06 ENCOUNTER — Ambulatory Visit (INDEPENDENT_AMBULATORY_CARE_PROVIDER_SITE_OTHER): Payer: Medicare Other | Admitting: Nurse Practitioner

## 2020-06-06 ENCOUNTER — Encounter: Payer: Self-pay | Admitting: Nurse Practitioner

## 2020-06-06 DIAGNOSIS — Z0000 Encounter for general adult medical examination without abnormal findings: Secondary | ICD-10-CM

## 2020-06-06 NOTE — Patient Instructions (Addendum)
Gilbert Cohen , Thank you for taking time to come for your Medicare Wellness Visit. I appreciate your ongoing commitment to your health goals. Please review the following plan we discussed and let me know if I can assist you in the future.   Screening recommendations/referrals: Colonoscopy: Not due at this time.   Recommended yearly ophthalmology/optometry visit for glaucoma screening and checkup Recommended yearly dental visit for hygiene and checkup  Vaccinations: Influenza vaccine: Completed Pneumococcal vaccine: Not due at this time.  Tdap vaccine: Completed due in 2024  Shingles vaccine: Not due at this time.     Advanced directives: N/A   Conditions/risks identified: None   Next appointment: 10-18-2020 @ 4pm with Dr. Lodema Hong   Preventive Care 40-64 Years, Male Preventive care refers to lifestyle choices and visits with your health care provider that can promote health and wellness. What does preventive care include?  A yearly physical exam. This is also called an annual well check.  Dental exams once or twice a year.  Routine eye exams. Ask your health care provider how often you should have your eyes checked.  Personal lifestyle choices, including:  Daily care of your teeth and gums.  Regular physical activity.  Eating a healthy diet.  Avoiding tobacco and drug use.  Limiting alcohol use.  Practicing safe sex.  Taking low-dose aspirin every day starting at age 36. What happens during an annual well check? The services and screenings done by your health care provider during your annual well check will depend on your age, overall health, lifestyle risk factors, and family history of disease. Counseling  Your health care provider may ask you questions about your:  Alcohol use.  Tobacco use.  Drug use.  Emotional well-being.  Home and relationship well-being.  Sexual activity.  Eating habits.  Work and work Astronomer. Screening  You may have the  following tests or measurements:  Height, weight, and BMI.  Blood pressure.  Lipid and cholesterol levels. These may be checked every 5 years, or more frequently if you are over 58 years old.  Skin check.  Lung cancer screening. You may have this screening every year starting at age 72 if you have a 30-pack-year history of smoking and currently smoke or have quit within the past 15 years.  Fecal occult blood test (FOBT) of the stool. You may have this test every year starting at age 15.  Flexible sigmoidoscopy or colonoscopy. You may have a sigmoidoscopy every 5 years or a colonoscopy every 10 years starting at age 43.  Prostate cancer screening. Recommendations will vary depending on your family history and other risks.  Hepatitis C blood test.  Hepatitis B blood test.  Sexually transmitted disease (STD) testing.  Diabetes screening. This is done by checking your blood sugar (glucose) after you have not eaten for a while (fasting). You may have this done every 1-3 years. Discuss your test results, treatment options, and if necessary, the need for more tests with your health care provider. Vaccines  Your health care provider may recommend certain vaccines, such as:  Influenza vaccine. This is recommended every year.  Tetanus, diphtheria, and acellular pertussis (Tdap, Td) vaccine. You may need a Td booster every 10 years.  Zoster vaccine. You may need this after age 54.  Pneumococcal 13-valent conjugate (PCV13) vaccine. You may need this if you have certain conditions and have not been vaccinated.  Pneumococcal polysaccharide (PPSV23) vaccine. You may need one or two doses if you smoke cigarettes or if you  have certain conditions. Talk to your health care provider about which screenings and vaccines you need and how often you need them. This information is not intended to replace advice given to you by your health care provider. Make sure you discuss any questions you have with  your health care provider. Document Released: 07/20/2015 Document Revised: 03/12/2016 Document Reviewed: 04/24/2015 Elsevier Interactive Patient Education  2017 [employer].  Fall Prevention in the Home Falls can cause injuries. They can happen to people of all ages. There are many things you can do to make your home safe and to help prevent falls. What can I do on the outside of my home?  Regularly fix the edges of walkways and driveways and fix any cracks.  Remove anything that might make you trip as you walk through a door, such as a raised step or threshold.  Trim any bushes or trees on the path to your home.  Use bright outdoor lighting.  Clear any walking paths of anything that might make someone trip, such as rocks or tools.  Regularly check to see if handrails are loose or broken. Make sure that both sides of any steps have handrails.  Any raised decks and porches should have guardrails on the edges.  Have any leaves, snow, or ice cleared regularly.  Use sand or salt on walking paths during winter.  Clean up any spills in your garage right away. This includes oil or grease spills. What can I do in the bathroom?  Use night lights.  Install grab bars by the toilet and in the tub and shower. Do not use towel bars as grab bars.  Use non-skid mats or decals in the tub or shower.  If you need to sit down in the shower, use a plastic, non-slip stool.  Keep the floor dry. Clean up any water that spills on the floor as soon as it happens.  Remove soap buildup in the tub or shower regularly.  Attach bath mats securely with double-sided non-slip rug tape.  Do not have throw rugs and other things on the floor that can make you trip. What can I do in the bedroom?  Use night lights.  Make sure that you have a light by your bed that is easy to reach.  Do not use any sheets or blankets that are too big for your bed. They should not hang down onto the floor.  Have a firm  chair that has side arms. You can use this for support while you get dressed.  Do not have throw rugs and other things on the floor that can make you trip. What can I do in the kitchen?  Clean up any spills right away.  Avoid walking on wet floors.  Keep items that you use a lot in easy-to-reach places.  If you need to reach something above you, use a strong step stool that has a grab bar.  Keep electrical cords out of the way.  Do not use floor polish or wax that makes floors slippery. If you must use wax, use non-skid floor wax.  Do not have throw rugs and other things on the floor that can make you trip. What can I do with my stairs?  Do not leave any items on the stairs.  Make sure that there are handrails on both sides of the stairs and use them. Fix handrails that are broken or loose. Make sure that handrails are as long as the stairways.  Check any carpeting  to make sure that it is firmly attached to the stairs. Fix any carpet that is loose or worn.  Avoid having throw rugs at the top or bottom of the stairs. If you do have throw rugs, attach them to the floor with carpet tape.  Make sure that you have a light switch at the top of the stairs and the bottom of the stairs. If you do not have them, ask someone to add them for you. What else can I do to help prevent falls?  Wear shoes that:  Do not have high heels.  Have rubber bottoms.  Are comfortable and fit you well.  Are closed at the toe. Do not wear sandals.  If you use a stepladder:  Make sure that it is fully opened. Do not climb a closed stepladder.  Make sure that both sides of the stepladder are locked into place.  Ask someone to hold it for you, if possible.  Clearly mark and make sure that you can see:  Any grab bars or handrails.  First and last steps.  Where the edge of each step is.  Use tools that help you move around (mobility aids) if they are needed. These  include:  Canes.  Walkers.  Scooters.  Crutches.  Turn on the lights when you go into a dark area. Replace any light bulbs as soon as they burn out.  Set up your furniture so you have a clear path. Avoid moving your furniture around.  If any of your floors are uneven, fix them.  If there are any pets around you, be aware of where they are.  Review your medicines with your doctor. Some medicines can make you feel dizzy. This can increase your chance of falling. Ask your doctor what other things that you can do to help prevent falls. This information is not intended to replace advice given to you by your health care provider. Make sure you discuss any questions you have with your health care provider. Document Released: 04/19/2009 Document Revised: 11/29/2015 Document Reviewed: 07/28/2014 Elsevier Interactive Patient Education  2017 [employer].

## 2020-06-06 NOTE — Progress Notes (Addendum)
Subjective:   Gilbert Cohen is a 39 y.o. male who presents for Medicare Annual/Subsequent preventive examination.         Objective:    There were no vitals filed for this visit. There is no height or weight on file to calculate BMI.  Advanced Directives 05/25/2018 04/08/2017 04/08/2017 03/05/2017  Does Patient Have a Medical Advance Directive? No No No No  Would patient like information on creating a medical advance directive? No - Patient declined No - Patient declined No - Patient declined -    Current Medications (verified) Outpatient Encounter Medications as of 06/06/2020  Medication Sig  . citalopram (CELEXA) 40 MG tablet Take 40 mg by mouth every morning.   . Omega-3 Fatty Acids (FISH OIL) 1000 MG CAPS Two daily   No facility-administered encounter medications on file as of 06/06/2020.    Allergies (verified) Patient has no known allergies.   History: Past Medical History:  Diagnosis Date  . Autistic spectrum disorder   . Hyperlipidemia   . Intellectual disability   . Mental retardation 1982   hypoxic brain injury at birth   No past surgical history on file. Family History  Problem Relation Age of Onset  . Hypertension Mother   . Cancer Sister 10       localized breast cancer in 2014   Social History   Socioeconomic History  . Marital status: Single    Spouse name: N/A  . Number of children: 0  . Years of education: 34  . Highest education level: Not on file  Occupational History  . Occupation: disabled   Tobacco Use  . Smoking status: Never Smoker  . Smokeless tobacco: Never Used  Vaping Use  . Vaping Use: Never used  Substance and Sexual Activity  . Alcohol use: No  . Drug use: No  . Sexual activity: Not Currently  Other Topics Concern  . Not on file  Social History Narrative   Lives in a group home    Social Determinants of Health   Financial Resource Strain:   . Difficulty of Paying Living Expenses: Not on file  Food  Insecurity:   . Worried About Programme researcher, broadcasting/film/video in the Last Year: Not on file  . Ran Out of Food in the Last Year: Not on file  Transportation Needs:   . Lack of Transportation (Medical): Not on file  . Lack of Transportation (Non-Medical): Not on file  Physical Activity:   . Days of Exercise per Week: Not on file  . Minutes of Exercise per Session: Not on file  Stress:   . Feeling of Stress : Not on file  Social Connections:   . Frequency of Communication with Friends and Family: Not on file  . Frequency of Social Gatherings with Friends and Family: Not on file  . Attends Religious Services: Not on file  . Active Member of Clubs or Organizations: Not on file  . Attends Banker Meetings: Not on file  . Marital Status: Not on file    Tobacco Counseling Counseling given: Not Answered                  Diabetic? No          Activities of Daily Living No flowsheet data found.  Patient Care Team: Kerri Perches, MD as PCP - General (Family Medicine)  Indicate any recent Medical Services you may have received from other than [facility] providers in the past year (date  may be approximate).     Assessment:   This is a routine wellness examination for Gilbert Cohen.  Hearing/Vision screen No exam data present  Dietary issues and exercise activities discussed:    Goals    . Increase physical activity      Depression Screen PHQ 2/9 Scores 04/19/2020 05/27/2019 12/08/2018 05/25/2018 03/15/2018 09/24/2017 09/08/2016  PHQ - 2 Score 0 0 0 0 - - -  Exception Documentation - - - - Other- indicate reason in comment box Medical reason Other- indicate reason in comment box  Not completed - - - - unable to complete - developmental disability     Fall Risk Fall Risk  04/19/2020 10/18/2019 05/27/2019 12/08/2018 05/25/2018  Falls in the past year? 0 0 0 0 0  Number falls in past yr: 0 0 0 - -  Injury with Fall? 0 0 0 0 -  Follow up Falls evaluation completed - - - -      Any stairs in or around the home? No  If so, are there any without handrails? No  Home free of loose throw rugs in walkways, pet beds, electrical cords, etc? Yes  Adequate lighting in your home to reduce risk of falls? Yes   ASSISTIVE DEVICES UTILIZED TO PREVENT FALLS:  Life alert? No  Use of a cane, walker or w/c? No  Grab bars in the bathroom? No  Shower chair or bench in shower? No  Elevated toilet seat or a handicapped toilet? No   TIMED UP AND GO:  Was the test performed? No .     Cognitive Function:     6CIT Screen 05/27/2019 05/25/2018  What Year? 0 points 0 points  What month? 0 points 0 points  What time? 0 points 0 points  Count back from 20 0 points 0 points  Months in reverse 0 points 0 points  Repeat phrase 2 points 0 points  Total Score 2 0    Immunizations Immunization History  Administered Date(s) Administered  . Influenza,inj,Quad PF,6+ Mos 05/18/2013, 04/14/2014, 04/24/2015, 05/07/2016, 05/14/2017, 03/15/2018, 04/23/2020  . PPD Test 07/13/2017, 10/13/2017  . Tdap 12/06/2012    TDAP status: Up to date Flu Vaccine status: Up to date Pneumococcal vaccine status: Declined,  Education has been provided regarding the importance of this vaccine but patient still declined. Advised may receive this vaccine at local pharmacy or Health Dept. Aware to provide a copy of the vaccination record if obtained from local pharmacy or Health Dept. Verbalized acceptance and understanding.  Covid-19 vaccine status: Information provided on how to obtain vaccines.   Qualifies for Shingles Vaccine? No   Zostavax completed No   Shingrix Completed?: No.    Education has been provided regarding the importance of this vaccine. Patient has been advised to call insurance company to determine out of pocket expense if they have not yet received this vaccine. Advised may also receive vaccine at local pharmacy or Health Dept. Verbalized acceptance and understanding.  Screening  Tests Health Maintenance  Topic Date Due  . Hepatitis C Screening  Never done  . COVID-19 Vaccine (1) Never done  . TETANUS/TDAP  12/07/2022  . INFLUENZA VACCINE  Completed  . HIV Screening  Completed    Health Maintenance  Health Maintenance Due  Topic Date Due  . Hepatitis C Screening  Never done  . COVID-19 Vaccine (1) Never done     Lung Cancer Screening: (Low Dose CT Chest recommended if Age 83-80 years, 30 pack-year currently smoking OR have quit  w/in 15years.) does not qualify.    Additional Screening:  Hepatitis C Screening: does qualify; not completed.   Vision Screening: Recommended annual ophthalmology exams for early detection of glaucoma and other disorders of the eye. Is the patient up to date with their annual eye exam?  Yes    Who is the provider or what is the name of the office in which the patient attends annual eye exams? Unsure   If pt is not established with a provider, would they like to be referred to a provider to establish care? No .   Dental Screening: Recommended annual dental exams for proper oral hygiene  Community Resource Referral / Chronic Care Management: CRR required this visit?  No   CCM required this visit?  No      Plan:     I have personally reviewed and noted the following in the patient's chart:   . Medical and social history . Use of alcohol, tobacco or illicit drugs  . Current medications and supplements . Functional ability and status . Nutritional status . Physical activity . Advanced directives . List of other physicians . Hospitalizations, surgeries, and ER visits in previous 12 months . Vitals . Screenings to include cognitive, depression, and falls . Referrals and appointments  In addition, I have reviewed and discussed with patient certain preventive protocols, quality metrics, and best practice recommendations. A written personalized care plan for preventive services as well as general preventive health  recommendations were provided to patient.     Dellia Cloud, LPN   [id number]   Nurse Notes: AWV conducted over the phone with pt consent to televisit via audio. Pt was present in group home where he resides at the time of call and provider present in office. Call took approx 30 min.   Date:  06/06/2020   Location of Patient: Home Location of Provider: Office Consent was obtain for visit to be over via telehealth. I verified that I am speaking with the correct person using two identifiers.  I connected with  Gilbert Cohen on 06/06/20 via telephone and verified that I am speaking with the correct person using two identifiers.   I discussed the limitations of evaluation and management by telemedicine. The patient expressed understanding and agreed to proceed.  AWV questions answered by Dellia Cloud, LPN.  Pt declined a call back from me to review his responses and answer any questions.

## 2020-09-03 ENCOUNTER — Other Ambulatory Visit: Payer: Self-pay

## 2020-09-03 MED ORDER — CITALOPRAM HYDROBROMIDE 40 MG PO TABS
40.0000 mg | ORAL_TABLET | Freq: Every morning | ORAL | 0 refills | Status: DC
Start: 2020-09-03 — End: 2020-10-10

## 2020-10-10 ENCOUNTER — Other Ambulatory Visit: Payer: Self-pay

## 2020-10-10 MED ORDER — CITALOPRAM HYDROBROMIDE 40 MG PO TABS
40.0000 mg | ORAL_TABLET | Freq: Every morning | ORAL | 5 refills | Status: DC
Start: 2020-10-10 — End: 2021-05-01

## 2020-10-18 ENCOUNTER — Ambulatory Visit (INDEPENDENT_AMBULATORY_CARE_PROVIDER_SITE_OTHER): Payer: Medicare Other | Admitting: Family Medicine

## 2020-10-18 ENCOUNTER — Encounter: Payer: Self-pay | Admitting: Family Medicine

## 2020-10-18 ENCOUNTER — Other Ambulatory Visit: Payer: Self-pay

## 2020-10-18 VITALS — BP 132/88 | HR 78 | Temp 97.5°F | Ht 67.0 in | Wt 230.0 lb

## 2020-10-18 DIAGNOSIS — F89 Unspecified disorder of psychological development: Secondary | ICD-10-CM

## 2020-10-18 DIAGNOSIS — Z1159 Encounter for screening for other viral diseases: Secondary | ICD-10-CM

## 2020-10-18 DIAGNOSIS — E669 Obesity, unspecified: Secondary | ICD-10-CM

## 2020-10-18 DIAGNOSIS — E785 Hyperlipidemia, unspecified: Secondary | ICD-10-CM

## 2020-10-18 DIAGNOSIS — F32A Depression, unspecified: Secondary | ICD-10-CM

## 2020-10-18 DIAGNOSIS — R7301 Impaired fasting glucose: Secondary | ICD-10-CM

## 2020-10-18 MED ORDER — MONTELUKAST SODIUM 10 MG PO TABS: 10.0000 mg | ORAL_TABLET | Freq: Every day | ORAL | 3 refills | Status: DC

## 2020-10-18 NOTE — Patient Instructions (Signed)
F/U in 6 months, call if you need me sooner  Lipid, cmp and EGFr, HBA1C and hepatitis C screen today and CBC  It is important that you exercise regularly at least 30 minutes 5 times a week. If you develop chest pain, have severe difficulty breathing, or feel very tired, stop exercising immediately and seek medical attention   Thanks for choosing [facility], we consider it a privelige to serve you.   New medication, once daily singulair for allergies

## 2020-10-19 ENCOUNTER — Encounter: Payer: Self-pay | Admitting: Family Medicine

## 2020-10-19 ENCOUNTER — Other Ambulatory Visit: Payer: Self-pay | Admitting: Family Medicine

## 2020-10-19 LAB — CMP14+EGFR
ALT: 35 IU/L (ref 0–44)
AST: 22 IU/L (ref 0–40)
Albumin/Globulin Ratio: 2.1 (ref 1.2–2.2)
Albumin: 5 g/dL (ref 4.0–5.0)
Alkaline Phosphatase: 63 IU/L (ref 44–121)
BUN/Creatinine Ratio: 15 (ref 9–20)
BUN: 16 mg/dL (ref 6–20)
Bilirubin Total: 0.8 mg/dL (ref 0.0–1.2)
CO2: 22 mmol/L (ref 20–29)
Calcium: 9.6 mg/dL (ref 8.7–10.2)
Chloride: 103 mmol/L (ref 96–106)
Creatinine, Ser: 1.1 mg/dL (ref 0.76–1.27)
Globulin, Total: 2.4 g/dL (ref 1.5–4.5)
Glucose: 92 mg/dL (ref 65–99)
Potassium: 4.2 mmol/L (ref 3.5–5.2)
Sodium: 144 mmol/L (ref 134–144)
Total Protein: 7.4 g/dL (ref 6.0–8.5)
eGFR: 88 mL/min/{1.73_m2} (ref >59)

## 2020-10-19 LAB — CBC
Hematocrit: 45.4 % (ref 37.5–51.0)
Hemoglobin: 14.8 g/dL (ref 13.0–17.7)
MCH: 26.7 pg (ref 26.6–33.0)
MCHC: 32.6 g/dL (ref 31.5–35.7)
MCV: 82 fL (ref 79–97)
Platelets: 189 10*3/uL (ref 150–450)
RBC: 5.54 x10E6/uL (ref 4.14–5.80)
RDW: 14.6 % (ref 11.6–15.4)
WBC: 3.4 10*3/uL (ref 3.4–10.8)

## 2020-10-19 LAB — LIPID PANEL
Chol/HDL Ratio: 6.2 ratio — ABNORMAL HIGH (ref 0.0–5.0)
Cholesterol, Total: 192 mg/dL (ref 100–199)
HDL: 31 mg/dL — ABNORMAL LOW (ref >39)
LDL Chol Calc (NIH): 130 mg/dL — ABNORMAL HIGH (ref 0–99)
Triglycerides: 171 mg/dL — ABNORMAL HIGH (ref 0–149)
VLDL Cholesterol Cal: 31 mg/dL (ref 5–40)

## 2020-10-19 LAB — HEMOGLOBIN A1C
Est. average glucose Bld gHb Est-mCnc: 131 mg/dL
Hgb A1c MFr Bld: 6.2 % — ABNORMAL HIGH (ref 4.8–5.6)

## 2020-10-19 LAB — HEPATITIS C ANTIBODY: Hep C Virus Ab: <0.1 s/co ratio (ref 0.0–0.9)

## 2020-10-19 MED ORDER — ROSUVASTATIN CALCIUM 10 MG PO TABS: 10.0000 mg | ORAL_TABLET | Freq: Every day | ORAL | 3 refills | Status: DC

## 2020-10-19 NOTE — Assessment & Plan Note (Signed)
Controlled, no change in medication Managed by Psychiatry 

## 2020-10-19 NOTE — Assessment & Plan Note (Signed)
Reports being comfortable in current living condition

## 2020-10-19 NOTE — Assessment & Plan Note (Signed)
  Patient re-educated about  the importance of commitment to a  minimum of 150 minutes of exercise per week as able.  The importance of healthy food choices with portion control discussed, as well as eating regularly and within a 12 hour window most days. The need to choose "clean , green" food 50 to 75% of the time is discussed, as well as to make water the primary drink and set a goal of 64 ounces water daily.    Weight /BMI 10/18/2020 04/19/2020 10/18/2019  WEIGHT 230 lb 234 lb 209 lb  HEIGHT 5\' 7"  5\' 7"  5\' 7"   BMI 36.02 kg/m2 36.65 kg/m2 32.73 kg/m2

## 2020-10-19 NOTE — Assessment & Plan Note (Signed)
Deteriorated, needs to start statin and reduce fat intake Hyperlipidemia:Low fat diet discussed and encouraged.   Lipid Panel  Lab Results  Component Value Date   CHOL 192 10/18/2020   HDL 31 (L) 10/18/2020   LDLCALC 130 (H) 10/18/2020   TRIG 171 (H) 10/18/2020   CHOLHDL 6.2 (H) 10/18/2020

## 2020-10-19 NOTE — Progress Notes (Signed)
Gilbert Cohen     MRN: [id number]      DOB: July 01, 1981   HPI Gilbert Cohen is here for follow up and re-evaluation of chronic medical conditions, medication management and review of any available recent lab and radiology data.  Preventive health is updated, specifically  Cancer screening and Immunization.   C/o increased nasal congestion, denies sinus pressure or feverThere are no specific complaints   ROS Denies recent fever or chills. Denies sinus pressure, c/o increased  nasal congestion, denies ear pain or sore throat. Denies chest congestion, productive cough or wheezing. Denies chest pains, palpitations and leg swelling Denies abdominal pain, nausea, vomiting,diarrhea or constipation.   Denies dysuria, frequency, hesitancy or incontinence. Denies joint pain, swelling and limitation in mobility. Denies headaches, seizures, numbness, or tingling. Denies uncontrolled  depression, anxiety or insomnia. Denies skin break down or rash.   PE  BP 132/88 (BP Location: Right Arm, Patient Position: Sitting, Cuff Size: Large)   Pulse 78   Temp (!) 97.5 F (36.4 C) (Temporal)   Ht 5\' 7"  (1.702 m)   Wt 230 lb (104.3 kg)   SpO2 96%   BMI 36.02 kg/m   Patient alert and oriented and in no cardiopulmonary distress.  HEENT: No facial asymmetry, EOMI,     Neck supple ., no sinus tenderness, no nasal congestion   Chest: Clear to auscultation bilaterally.  CVS: S1, S2 no murmurs, no S3.Regular rate.  ABD: Soft non tender.   Ext: No edema  MS: Adequate ROM spine, shoulders, hips and knees.  Skin: Intact, no ulcerations or rash noted.  Psych: Good eye contact,  not anxious or depressed appearing.  CNS: CN 2-12 intact, power,  normal throughout.no focal deficits noted.   Assessment & Plan  Depression Controlled, no change in medication Managed by Psychiatry  Obesity (BMI 30.0-34.9)  Patient re-educated about  the importance of commitment to a  minimum of 150 minutes of  exercise per week as able.  The importance of healthy food choices with portion control discussed, as well as eating regularly and within a 12 hour window most days. The need to choose "clean , green" food 50 to 75% of the time is discussed, as well as to make water the primary drink and set a goal of 64 ounces water daily.    Weight /BMI 10/18/2020 04/19/2020 10/18/2019  WEIGHT 230 lb 234 lb 209 lb  HEIGHT 5\' 7"  5\' 7"  5\' 7"   BMI 36.02 kg/m2 36.65 kg/m2 32.73 kg/m2      Dyslipidemia Deteriorated, needs to start statin and reduce fat intake Hyperlipidemia:Low fat diet discussed and encouraged.   Lipid Panel  Lab Results  Component Value Date   CHOL 192 10/18/2020   HDL 31 (L) 10/18/2020   LDLCALC 130 (H) 10/18/2020   TRIG 171 (H) 10/18/2020   CHOLHDL 6.2 (H) 10/18/2020       Developmental disability Reports being comfortable in current living condition  Impaired fasting glucose Patient educated about the importance of limiting  Carbohydrate intake , the need to commit to daily physical activity for a minimum of 30 minutes , and to commit weight loss. The fact that changes in all these areas will reduce or eliminate all together the development of diabetes is stressed.   Deteriorated  Diabetic Labs Latest Ref Rng & Units 10/18/2020 11/22/2019 12/08/2018 01/13/2018 04/08/2017  HbA1c 4.8 - 5.6 % 6.2(H) 5.7(H) - - -  Chol 100 - 199 mg/dL 11/24/2019 02/07/2019) - - -  HDL >  39 mg/dL 70(J) 50(K) - - -  Calc LDL 0 - 99 mg/dL 938(H) 829(H) - - -  Triglycerides 0 - 149 mg/dL 371(I) 967(E) - - -  Creatinine 0.76 - 1.27 mg/dL 9.38 1.01 7.51 0.25 8.52(D)  GFR >60.00 mL/min - - - 94.90 -   BP/Weight 10/18/2020 04/19/2020 10/18/2019 05/27/2019 12/08/2018 05/25/2018 03/15/2018  Systolic BP 132 136 110 110 110 108 110  Diastolic BP 88 80 58 58 58 70 70  Wt. (Lbs) 230 234 209 209 209.12 209 210.04  BMI 36.02 36.65 32.73 32.73 32.75 32.73 32.9   No flowsheet data found.

## 2020-10-19 NOTE — Assessment & Plan Note (Signed)
Patient educated about the importance of limiting  Carbohydrate intake , the need to commit to daily physical activity for a minimum of 30 minutes , and to commit weight loss. The fact that changes in all these areas will reduce or eliminate all together the development of diabetes is stressed.   Deteriorated  Diabetic Labs Latest Ref Rng & Units 10/18/2020 11/22/2019 12/08/2018 01/13/2018 04/08/2017  HbA1c 4.8 - 5.6 % 6.2(H) 5.7(H) - - -  Chol 100 - 199 mg/dL 861 683(F) - - -  HDL >29 mg/dL 02(X) 11(B) - - -  Calc LDL 0 - 99 mg/dL 520(E) 022(V) - - -  Triglycerides 0 - 149 mg/dL 361(Q) 244(L) - - -  Creatinine 0.76 - 1.27 mg/dL 7.53 0.05 1.10 2.11 1.73(V)  GFR >60.00 mL/min - - - 94.90 -   BP/Weight 10/18/2020 04/19/2020 10/18/2019 05/27/2019 12/08/2018 05/25/2018 03/15/2018  Systolic BP 132 136 110 110 110 108 110  Diastolic BP 88 80 58 58 58 70 70  Wt. (Lbs) 230 234 209 209 209.12 209 210.04  BMI 36.02 36.65 32.73 32.73 32.75 32.73 32.9   No flowsheet data found.

## 2020-10-25 ENCOUNTER — Other Ambulatory Visit: Payer: Self-pay

## 2020-10-25 DIAGNOSIS — R7301 Impaired fasting glucose: Secondary | ICD-10-CM

## 2020-10-25 DIAGNOSIS — E785 Hyperlipidemia, unspecified: Secondary | ICD-10-CM

## 2020-10-25 DIAGNOSIS — E669 Obesity, unspecified: Secondary | ICD-10-CM

## 2021-03-13 ENCOUNTER — Other Ambulatory Visit: Payer: Self-pay

## 2021-03-13 MED ORDER — MONTELUKAST SODIUM 10 MG PO TABS
10.0000 mg | ORAL_TABLET | Freq: Every day | ORAL | 3 refills | Status: DC
Start: 2021-03-13 — End: 2021-08-09

## 2021-04-15 NOTE — Progress Notes (Signed)
This encounter was created in error - please disregard.

## 2021-04-18 ENCOUNTER — Ambulatory Visit: Payer: Medicare Other | Admitting: Family Medicine

## 2021-04-24 ENCOUNTER — Ambulatory Visit (INDEPENDENT_AMBULATORY_CARE_PROVIDER_SITE_OTHER): Payer: Medicare Other | Admitting: Family Medicine

## 2021-04-24 ENCOUNTER — Encounter: Payer: Self-pay | Admitting: Family Medicine

## 2021-04-24 ENCOUNTER — Other Ambulatory Visit: Payer: Self-pay

## 2021-04-24 VITALS — BP 123/72 | HR 71 | Resp 16 | Ht 67.0 in | Wt 227.4 lb

## 2021-04-24 DIAGNOSIS — F32A Depression, unspecified: Secondary | ICD-10-CM

## 2021-04-24 DIAGNOSIS — Z23 Encounter for immunization: Secondary | ICD-10-CM

## 2021-04-24 DIAGNOSIS — E559 Vitamin D deficiency, unspecified: Secondary | ICD-10-CM

## 2021-04-24 DIAGNOSIS — F89 Unspecified disorder of psychological development: Secondary | ICD-10-CM

## 2021-04-24 DIAGNOSIS — R7301 Impaired fasting glucose: Secondary | ICD-10-CM

## 2021-04-24 DIAGNOSIS — E785 Hyperlipidemia, unspecified: Secondary | ICD-10-CM | POA: Diagnosis not present

## 2021-04-24 DIAGNOSIS — E669 Obesity, unspecified: Secondary | ICD-10-CM

## 2021-04-24 NOTE — Patient Instructions (Addendum)
F/U in 6 months, call if you need me before  Flu vaccine today  Fasting lipid, cmp and eGFR, TSH and vit D in the next 1 to 2 week  It is important that you exercise regularly at least 30 minutes 5 times a week. If you develop chest pain, have severe difficulty breathing, or feel very tired, stop exercising immediately and seek medical attention   Think about what you will eat, plan ahead. Choose " clean, green, fresh or frozen" over canned, processed or packaged foods which are more sugary, salty and fatty. 70 to 75% of food eaten should be vegetables and fruit. Three meals at set times with snacks allowed between meals, but they must be fruit or vegetables. Aim to eat over a 12 hour period , example 7 am to 7 pm, and STOP after  your last meal of the day. Drink water,generally about 64 ounces per day, no other drink is as healthy. Fruit juice is best enjoyed in a healthy way, by EATING the fruit.  Thanks for choosing [facility], we consider it a privelige to serve you.

## 2021-04-25 ENCOUNTER — Encounter: Payer: Self-pay | Admitting: Family Medicine

## 2021-04-25 NOTE — Assessment & Plan Note (Signed)
Controlled, no change in medication Managed by Psych 

## 2021-04-25 NOTE — Assessment & Plan Note (Signed)
Hyperlipidemia:Low fat diet discussed and encouraged.   Lipid Panel  Lab Results  Component Value Date   CHOL 192 10/18/2020   HDL 31 (L) 10/18/2020   LDLCALC 130 (H) 10/18/2020   TRIG 171 (H) 10/18/2020   CHOLHDL 6.2 (H) 10/18/2020     Updated lab needed at/ before next visit. Uncontrolled , not at goal

## 2021-04-25 NOTE — Assessment & Plan Note (Signed)
Incapable of living independently, living in a supervised environment/ group home, well settled , involved in day program, more verbal than in the past

## 2021-04-25 NOTE — Assessment & Plan Note (Signed)
  Patient re-educated about  the importance of commitment to a  minimum of 150 minutes of exercise per week as able.  The importance of healthy food choices with portion control discussed, as well as eating regularly and within a 12 hour window most days. The need to choose "clean , green" food 50 to 75% of the time is discussed, as well as to make water the primary drink and set a goal of 64 ounces water daily.   Improving Weight /BMI 04/24/2021 10/18/2020 04/19/2020  WEIGHT 227 lb 6.4 oz 230 lb 234 lb  HEIGHT 5\' 7"  5\' 7"  5\' 7"   BMI 35.62 kg/m2 36.02 kg/m2 36.65 kg/m2    Improving

## 2021-04-25 NOTE — Progress Notes (Signed)
   Gilbert Cohen     MRN: [id number]      DOB: 09-03-1980   HPI Gilbert Cohen is here for follow up and re-evaluation of chronic medical conditions, medication management and review of any available recent lab and radiology data.  Preventive health is updated, specifically  Cancer screening and Immunization.   Treated by Psychiatry and reports doing well .  There are no new concerns.  There are no specific complaints   ROS Denies recent fever or chills. Denies sinus pressure, nasal congestion, ear pain or sore throat. Denies chest congestion, productive cough or wheezing. Denies chest pains, palpitations and leg swelling Denies abdominal pain, nausea, vomiting,diarrhea or constipation.   Denies dysuria, frequency, hesitancy or incontinence. Denies joint pain, swelling and limitation in mobility. Denies headaches, seizures, numbness, or tingling. Denies uncontrolled depression, anxiety or insomnia. Denies skin break down or rash.   PE  BP 123/72   Pulse 71   Resp 16   Ht 5\' 7"  (1.702 m)   Wt 227 lb 6.4 oz (103.1 kg)   SpO2 94%   BMI 35.62 kg/m   Patient alert  and in no cardiopulmonary distress.  HEENT: No facial asymmetry, EOMI,     Neck supple .  Chest: Clear to auscultation bilaterally.  CVS: S1, S2 no murmurs, no S3.Regular rate.  ABD: Soft non tender.   Ext: No edema  MS: Adequate ROM spine, shoulders, hips and knees.  Skin: Intact, no ulcerations or rash noted.  Psych: Good eye contact,  not anxious or depressed appearing.  CNS: CN 2-12 intact, power,  normal throughout.no focal deficits noted.   Assessment & Plan  Depression Controlled, no change in medication Managed by Psych  Dyslipidemia Hyperlipidemia:Low fat diet discussed and encouraged.   Lipid Panel  Lab Results  Component Value Date   CHOL 192 10/18/2020   HDL 31 (L) 10/18/2020   LDLCALC 130 (H) 10/18/2020   TRIG 171 (H) 10/18/2020   CHOLHDL 6.2 (H) 10/18/2020     Updated  lab needed at/ before next visit. Uncontrolled , not at goal  Obesity (BMI 30.0-34.9)  Patient re-educated about  the importance of commitment to a  minimum of 150 minutes of exercise per week as able.  The importance of healthy food choices with portion control discussed, as well as eating regularly and within a 12 hour window most days. The need to choose "clean , green" food 50 to 75% of the time is discussed, as well as to make water the primary drink and set a goal of 64 ounces water daily.   Improving Weight /BMI 04/24/2021 10/18/2020 04/19/2020  WEIGHT 227 lb 6.4 oz 230 lb 234 lb  HEIGHT 5\' 7"  5\' 7"  5\' 7"   BMI 35.62 kg/m2 36.02 kg/m2 36.65 kg/m2    Improving  Developmental disability Incapable of living independently, living in a supervised environment/ group home, well settled , involved in day program, more verbal than in the past

## 2021-05-01 ENCOUNTER — Other Ambulatory Visit: Payer: Self-pay | Admitting: Family Medicine

## 2021-07-15 ENCOUNTER — Telehealth: Payer: Self-pay | Admitting: Family Medicine

## 2021-07-15 DIAGNOSIS — F32A Depression, unspecified: Secondary | ICD-10-CM

## 2021-07-15 NOTE — Telephone Encounter (Signed)
Mother is calling she states that her and Gilbert Cohen when off for the holidays and talked and he is wanting to speak with someone , psychiatry

## 2021-07-16 NOTE — Telephone Encounter (Signed)
Referral entered  

## 2021-07-16 NOTE — Addendum Note (Signed)
Addended by: Abner Greenspan on: 07/16/2021 04:55 PM   Modules accepted: Orders

## 2021-07-18 ENCOUNTER — Ambulatory Visit: Payer: Medicare Other | Admitting: Family Medicine

## 2021-07-23 ENCOUNTER — Encounter (INDEPENDENT_AMBULATORY_CARE_PROVIDER_SITE_OTHER): Payer: Self-pay

## 2021-07-23 ENCOUNTER — Ambulatory Visit (INDEPENDENT_AMBULATORY_CARE_PROVIDER_SITE_OTHER): Payer: Medicare Other | Admitting: Licensed Clinical Social Worker

## 2021-07-23 ENCOUNTER — Other Ambulatory Visit: Payer: Self-pay

## 2021-07-23 DIAGNOSIS — F840 Autistic disorder: Secondary | ICD-10-CM

## 2021-07-24 ENCOUNTER — Telehealth (INDEPENDENT_AMBULATORY_CARE_PROVIDER_SITE_OTHER): Payer: Medicare Other | Admitting: Licensed Clinical Social Worker

## 2021-07-24 DIAGNOSIS — F32A Depression, unspecified: Secondary | ICD-10-CM

## 2021-07-24 DIAGNOSIS — F840 Autistic disorder: Secondary | ICD-10-CM

## 2021-07-24 NOTE — BH Specialist Note (Signed)
Virtual Behavioral Health Treatment Plan Team Note  MRN: [id number] NAME: Gilbert Cohen  DATE: 07/24/21  Start time:   30p End time:  4p Total time:  5 min  Total number of Virtual [place] Treatment Team Plan encounters: 1/4  Treatment Team Attendees: Royal Piedra, LCSW & Dr. Modesta Messing  Diagnoses: No diagnosis found.  Goals, Interventions and Follow-up Plan Goals: Increase healthy adjustment to current life circumstances Increase motivation to adhere to plan of care Interventions: Behavioral Activation CBT Cognitive Behavioral Therapy Medication Management Recommendations: no change in medication Follow-up Plan: biweekly VBH session  History of the present illness Presenting Problem/Current Symptoms: continued symptoms of DX  Psychiatric History  Depression: No Anxiety: No Mania: No Psychosis: No PTSD symptoms: No  Past Psychiatric History/Hospitalization(s): Hospitalization for psychiatric illness: No Prior Suicide Attempts: No Prior Self-injurious behavior: No  Psychosocial stressors  Social anxiety Self-harm Behaviors Risk Assessment none  Screenings PHQ-9 Assessments:  Depression screen [facility] 2/9 10/18/2020 06/06/2020 06/06/2020  Decreased Interest 0 1 1  Down, Depressed, Hopeless 0 0 0  PHQ - 2 Score 0 1 1   GAD-7 Assessments: No flowsheet data found.  Past Medical History Past Medical History:  Diagnosis Date   Autistic spectrum disorder    Hyperlipidemia    Intellectual disability    Mental retardation 1982   hypoxic brain injury at birth    Vital signs: There were no vitals filed for this visit.  Allergies:  Allergies as of 07/24/2021   (No Known Allergies)    Medication History Current medications:  Outpatient Encounter Medications as of 07/24/2021  Medication Sig   citalopram (CELEXA) 40 MG tablet TAKE ONE TABLET EVERY MORNING   montelukast (SINGULAIR) 10 MG tablet Take 1 tablet (10 mg total) by mouth at bedtime.   Omega-3 Fatty Acids  (FISH OIL) 1000 MG CAPS Two daily   rosuvastatin (CRESTOR) 10 MG tablet Take 1 tablet (10 mg total) by mouth daily.   No facility-administered encounter medications on file as of 07/24/2021.     Scribe for Treatment Team: Lubertha South, LCSW

## 2021-07-24 NOTE — Progress Notes (Signed)
Virtual behavioral Health Initiative (vBHI) Psychiatric Consultant Case Review   Gilbert Cohen is a 41 y.o. year old male with a history of depression, intellectual disability, chronic hypoxia brain injury, dyslipidemia.  He is his own guardian.  His mother advised him to see a therapy to work on his anger, and to precess his feelings.  According to abuse specialist, he is unsure if he would like to do therapy.  He is more interested in having girlfriend.  He lives in a group home.  He has been on citalopram for depression.   Assessment/Provisional Diagnosis # history of depression Although it was difficult to assess his mood symptoms given he was unable to answer PHQ-9, no significant mood symptoms reported except his irritability.  Will continue current dose at this time.    # intellectual disability He will benefit from psychosocial rehabilitation.  Will consider making a referral.  Of note, he was not interested in doing the therapy, although he agreed to have an appointment.  Will assess this at the next visit.   Recommendation  - continue citalopram 40 mg daily  - referral to psychosocial rehabilitation if he is interested.  Thank you for your consult. We will continue to follow the patient. Please contact vBHI  for any questions or concerns.   The above treatment considerations and suggestions are based on consultation with the [facility] specialist and/or PCP and a review of information available in the shared registry and the patients Electronic Health Record (EHR). I have not personally examined the patient. All recommendations should be implemented with consideration of the patient's relevant prior history and current clinical status. Please feel free to call me with any questions about the care of this patient.

## 2021-07-24 NOTE — BH Specialist Note (Signed)
[place] Initial TeleMedicine Clinical Assessment  MRN: [id number] NAME: Gilbert Cohen Date: 07/24/21  Start time: 11 End time: 72 Total time: 57   Types of Service: Telephone visit Referring Provider: Dr. Moshe Cipro Reason for Visit today: begin VBH   Patient/Family location: mother's home [place] Provider location: remote office All persons participating in visit: Patient, Patient's mother and [place] therapist  I connected with patient and/or family via Telephone or Video Enabled Telemedicine Application  (Video is Caregility application) and verified that I am speaking with the correct person using two identifiers.   Discussed confidentiality: No   I discussed the limitations of telemedicine and the availability of in person appointments.  Discussed there is a possibility of technology failure and discussed alternative modes of communication if that failure occurs.  I discussed that engaging in this telemedicine visit, they consent to the provision of behavioral healthcare and the services will be billed under their insurance.  Patient and/or legal guardian expressed understanding and consented to Telemedicine visit: No   Treatment History Patient recently received Inpatient Treatment: No  Patient currently being seen by therapist/psychiatrist: No  Patient currently receiving the following services: none  Past Psychiatric History/Diagnosis/Hospitalization(s): Anxiety: No Bipolar Disorder: No Depression: No Mania: No Psychosis: No Schizophrenia: No Personality Disorder: No Hospitalization for psychiatric illness: No History of Electroconvulsive Shock Therapy: No Prior Suicide Attempts: No  Decreased need for sleep: No  Euphoria: No  Self Injurious behaviors: No  Family History of mental illness: No  Family History of substance abuse: No  Substance Abuse: No  DUI: No  Insomnia: No   History of violence: No  Physical, sexual or emotional  abuse: No  Prior outpatient mental health therapy: No   Clinical Assessment *Unable to obtain as he did not understand the rating scale   Social Functioning Social maturity: below average; known disability Social judgement: below average; known disability  Stress Current stressors: "being made to have therapy" Familial stressors: "my mother is helpful" Sleep: fair Appetite: fair Coping ability: overwhelmed Patient taking medications as prescribed:  "no"  Current medications:  Outpatient Encounter Medications as of 07/23/2021  Medication Sig   citalopram (CELEXA) 40 MG tablet TAKE ONE TABLET EVERY MORNING   montelukast (SINGULAIR) 10 MG tablet Take 1 tablet (10 mg total) by mouth at bedtime.   Omega-3 Fatty Acids (FISH OIL) 1000 MG CAPS Two daily   rosuvastatin (CRESTOR) 10 MG tablet Take 1 tablet (10 mg total) by mouth daily.   No facility-administered encounter medications on file as of 07/23/2021.     Self-harm and/or Suicidal Behaviors Risk Assessment Self-harm risk factors: no Patient endorses recent self injurious thoughts and/or behaviors: No  Suicide ideations: No plan to harm self or others   Danger to Others Risk Assessment Danger to others risk factors: no Patient endorses recent thoughts of harming others: No    Substance Use Assessment Patient recently consumed alcohol: No  Patient recently used drugs: No  Patient is concerned about dependence or abuse of substances: No    Goals, Interventions and Follow-up Plan Goals: Increase healthy adjustment to current life circumstances and Increase motivation to adhere to plan of care Interventions: Behavioral Activation and CBT Cognitive Behavioral Therapy Follow-up Plan:  biweekly VBH session  Summary of Clinical Assessment Summary: Gilbert Cohen is a 41 yr old man that was present with his mother.  Writer was waiting on the video.  Ava and his mother were expecting a phone call.  Writer called Patient's  mother phone for today's session.  Mother answered and stated that Gilbert Cohen was present as well. Writer received permission from Elberta to have session with mother present.  Mother reports that she encouraged Gilbert Cohen to receive therapy "to let out his feelings and to stop having a mean face." Gilbert Cohen lives in a group home and has autism. He is unsure if he wants [place] support. LCSW discussed what psychotherapy is and is not and the importance of the therapeutic relationship to include open and honest communication between client and therapist and building trust.  Reviewed advantages and disadvantages of the therapeutic process and limitations to the therapeutic relationship including LCSW's role in maintaining the safety of the client, others and those in client's care.    Lubertha South, LCSW

## 2021-08-06 ENCOUNTER — Other Ambulatory Visit: Payer: Self-pay

## 2021-08-06 ENCOUNTER — Ambulatory Visit (INDEPENDENT_AMBULATORY_CARE_PROVIDER_SITE_OTHER): Payer: Medicare Other | Admitting: Licensed Clinical Social Worker

## 2021-08-06 DIAGNOSIS — F840 Autistic disorder: Secondary | ICD-10-CM

## 2021-08-06 DIAGNOSIS — F32A Depression, unspecified: Secondary | ICD-10-CM

## 2021-08-09 ENCOUNTER — Other Ambulatory Visit: Payer: Self-pay | Admitting: Family Medicine

## 2021-08-13 ENCOUNTER — Other Ambulatory Visit: Payer: Self-pay

## 2021-08-13 ENCOUNTER — Ambulatory Visit (INDEPENDENT_AMBULATORY_CARE_PROVIDER_SITE_OTHER): Payer: Medicare Other | Admitting: Licensed Clinical Social Worker

## 2021-08-13 DIAGNOSIS — F840 Autistic disorder: Secondary | ICD-10-CM

## 2021-08-13 DIAGNOSIS — F32A Depression, unspecified: Secondary | ICD-10-CM

## 2021-08-21 NOTE — BH Specialist Note (Signed)
[place] Follow Up Assessment  MRN: [id number] NAME: JESSI VERGE Date: 08/21/21  Start time: 11 End time: [id number] Total time:  5 min  Type of Contact: Follow up Call  Current concerns/stressors: denies  Screens/Assessment Tools:  Does not understand the scale  Functional Assessment:  Sleep: WNL Appetite: WNL Coping ability: WNL Patient taking medications as prescribed:    Current medications:  Outpatient Encounter Medications as of 08/06/2021  Medication Sig   citalopram (CELEXA) 40 MG tablet TAKE ONE TABLET EVERY MORNING   Omega-3 Fatty Acids (FISH OIL) 1000 MG CAPS Two daily   rosuvastatin (CRESTOR) 10 MG tablet Take 1 tablet (10 mg total) by mouth daily.   [DISCONTINUED] montelukast (SINGULAIR) 10 MG tablet Take 1 tablet (10 mg total) by mouth at bedtime.   No facility-administered encounter medications on file as of 08/06/2021.    Self-harm and/or Suicidal Behaviors Risk Assessment Self-harm risk factors: no Patient endorses recent self injurious thoughts and/or behaviors: No   Suicide ideations: No plan to harm self or others   Danger to Others Risk Assessment Danger to others risk factors: no Patient endorses recent thoughts of harming others: No    Substance Use Assessment Patient recently consumed alcohol: No  Patient recently used drugs: No  Patient is concerned about dependence or abuse of substances: No    Goals, Interventions and Follow-up Plan Goals: Increase healthy adjustment to current life circumstances Interventions: Mindfulness or Relaxation Training   Summary of Clinical Assessment  Charlie was present and reports that he wants a referral to a PSR (psychosocial rehab) facility.     Follow-up Plan:  3 week follow up Lubertha South, LCSW

## 2021-08-21 NOTE — BH Specialist Note (Signed)
[place] Follow Up Assessment  MRN: [id number] NAME: Gilbert Cohen Date: 08/21/21  Start time: 11a End time: 1115a Total time: 15  Type of Contact: Follow up Call  Current concerns/stressors: he denies any stressors  Screens/Assessment Tools:  He did not understand the scale  Functional Assessment:  Sleep: WNL Appetite: WNL Coping ability: WNL Patient taking medications as prescribed:    Current medications:  Outpatient Encounter Medications as of 08/13/2021  Medication Sig   citalopram (CELEXA) 40 MG tablet TAKE ONE TABLET EVERY MORNING   montelukast (SINGULAIR) 10 MG tablet TAKE ONE TABLET BY MOUTH AT BEDTIME   Omega-3 Fatty Acids (FISH OIL) 1000 MG CAPS Two daily   rosuvastatin (CRESTOR) 10 MG tablet Take 1 tablet (10 mg total) by mouth daily.   No facility-administered encounter medications on file as of 08/13/2021.    Self-harm and/or Suicidal Behaviors Risk Assessment Self-harm risk factors: no Patient endorses recent self injurious thoughts and/or behaviors: No   Suicide ideations: No plan to harm self or others   Danger to Others Risk Assessment Danger to others risk factors: no Patient endorses recent thoughts of harming others: No    Substance Use Assessment Patient recently consumed alcohol: No  Patient recently used drugs: No  Patient is concerned about dependence or abuse of substances: No    Goals, Interventions and Follow-up Plan Goals: Increase healthy adjustment to current life circumstances Interventions: Mindfulness or Relaxation Training   Summary of Clinical Assessment  Gilbert Cohen was present for his session.  He reports that he is at home and not his mother's home today.  He denies any concerns and reports that he does not believe that there is anything that he needs to change.  He does affirm having MH treatment in the past and that he wants a girlfriend.  Discussion of how important that is too him and how social skills  training can help. He denies therapy can help and reports the does not want treatment anymore.    Follow-up Plan:  Will follow up  in 4 weeks to assess readiness for change Lubertha South, LCSW

## 2021-08-22 ENCOUNTER — Ambulatory Visit (INDEPENDENT_AMBULATORY_CARE_PROVIDER_SITE_OTHER): Payer: Medicare Other

## 2021-08-22 ENCOUNTER — Other Ambulatory Visit: Payer: Self-pay

## 2021-08-22 ENCOUNTER — Encounter (INDEPENDENT_AMBULATORY_CARE_PROVIDER_SITE_OTHER): Payer: Self-pay

## 2021-08-22 VITALS — Wt 227.0 lb

## 2021-08-22 DIAGNOSIS — Z0000 Encounter for general adult medical examination without abnormal findings: Secondary | ICD-10-CM | POA: Diagnosis not present

## 2021-08-22 NOTE — Patient Instructions (Signed)
°  Gilbert Cohen , Thank you for taking time to come for your Medicare Wellness Visit. I appreciate your ongoing commitment to your health goals. Please review the following plan we discussed and let me know if I can assist you in the future.   These are the goals we discussed:  Goals      Increase physical activity     Social and Functional Skills Optimized     Wants to form a relationship (get a girlfriend).         This is a list of the screening recommended for you and due dates:  Health Maintenance  Topic Date Due   COVID-19 Vaccine (1) 10/18/2021*   Tetanus Vaccine  12/07/2022   Flu Shot  Completed   Hepatitis C Screening: USPSTF Recommendation to screen - Ages 18-79 yo.  Completed   HIV Screening  Completed   HPV Vaccine  Aged Out  *Topic was postponed. The date shown is not the original due date.

## 2021-08-22 NOTE — Progress Notes (Signed)
I connected with  Gilbert Cohen on 08/22/21 by a audio enabled telemedicine application and verified that I am speaking with the correct person using two identifiers.  Patient Location: Home  Provider Location: Office/Clinic  I discussed the limitations of evaluation and management by telemedicine. The patient expressed understanding and agreed to proceed.  Subjective:   Gilbert Cohen is a 41 y.o. male who presents for Medicare Annual/Subsequent preventive examination.  Review of Systems     Cardiac Risk Factors include: dyslipidemia;male gender;obesity (BMI >30kg/m2)     Objective:    Today's Vitals   08/22/21 1627  Weight: 227 lb (103 kg)  PainSc: 0-No pain   Body mass index is 35.55 kg/m.  Advanced Directives 06/06/2020 05/25/2018 04/08/2017 04/08/2017 03/05/2017  Does Patient Have a Medical Advance Directive? No No No No No  Would patient like information on creating a medical advance directive? No - Patient declined No - Patient declined No - Patient declined No - Patient declined -    Current Medications (verified) Outpatient Encounter Medications as of 08/22/2021  Medication Sig   citalopram (CELEXA) 40 MG tablet TAKE ONE TABLET EVERY MORNING   montelukast (SINGULAIR) 10 MG tablet TAKE ONE TABLET BY MOUTH AT BEDTIME   Omega-3 Fatty Acids (FISH OIL) 1000 MG CAPS Two daily   rosuvastatin (CRESTOR) 10 MG tablet Take 1 tablet (10 mg total) by mouth daily.   No facility-administered encounter medications on file as of 08/22/2021.    Allergies (verified) Patient has no known allergies.   History: Past Medical History:  Diagnosis Date   Autistic spectrum disorder    Hyperlipidemia    Intellectual disability    Mental retardation 1982   hypoxic brain injury at birth   History reviewed. No pertinent surgical history. Family History  Problem Relation Age of Onset   Hypertension Mother    Cancer Sister 103       localized breast cancer in 2014   Social  History   Socioeconomic History   Marital status: Single    Spouse name: N/A   Number of children: 0   Years of education: 12   Highest education level: Not on file  Occupational History   Occupation: disabled   Tobacco Use   Smoking status: Never   Smokeless tobacco: Never  Vaping Use   Vaping Use: Never used  Substance and Sexual Activity   Alcohol use: No   Drug use: No   Sexual activity: Not Currently  Other Topics Concern   Not on file  Social History Narrative   Lives in a group home    Social Determinants of Health   Financial Resource Strain: Low Risk    Difficulty of Paying Living Expenses: Not hard at all  Food Insecurity: No Food Insecurity   Worried About Charity fundraiser in the Last Year: Never true   [place] in the Last Year: Never true  Transportation Needs: No Transportation Needs   Lack of Transportation (Medical): No   Lack of Transportation (Non-Medical): No  Physical Activity: Inactive   Days of Exercise per Week: 0 days   Minutes of Exercise per Session: 0 min  Stress: No Stress Concern Present   Feeling of Stress : Not at all  Social Connections: Socially Isolated   Frequency of Communication with Friends and Family: Twice a week   Frequency of Social Gatherings with Friends and Family: More than three times a week   Attends Religious Services: Never  Active Member of Clubs or Organizations: No   Attends Archivist Meetings: Never   Marital Status: Never married    Tobacco Counseling Counseling given: Yes   Clinical Intake:  Pre-visit preparation completed: Yes  Pain : No/denies pain Pain Score: 0-No pain     BMI - recorded: 35.55 Nutritional Status: BMI > 30  Obese Nutritional Risks: None Diabetes: No  How often do you need to have someone help you when you read instructions, pamphlets, or other written materials from your doctor or pharmacy?: 1 - Never What is the last grade level you completed in  school?: 12  Diabetic?NO  Interpreter Needed?: No      Activities of Daily Living In your present state of health, do you have any difficulty performing the following activities: 08/22/2021  Hearing? N  Vision? N  Difficulty concentrating or making decisions? N  Walking or climbing stairs? N  Dressing or bathing? N  Doing errands, shopping? N  Preparing Food and eating ? Y  Using the Toilet? N  In the past six months, have you accidently leaked urine? N  Do you have problems with loss of bowel control? N  Managing your Medications? Y  Managing your Finances? Y  Housekeeping or managing your Housekeeping? Y  Some recent data might be hidden    Patient Care Team: Fayrene Helper, MD as PCP - General (Family Medicine)  Indicate any recent Medical Services you may have received from other than [facility] providers in the past year (date may be approximate).     Assessment:   This is a routine wellness examination for Gilbert Cohen.  Hearing/Vision screen No results found.  Dietary issues and exercise activities discussed: Current Exercise Habits: The patient does not participate in regular exercise at present, Exercise limited by: None identified   Goals Addressed   None   Depression Screen PHQ 2/9 Scores 08/22/2021 08/22/2021 10/18/2020 06/06/2020 06/06/2020 04/19/2020 05/27/2019  PHQ - 2 Score 0 0 0 1 1 0 0  Exception Documentation - - - - - - -  Not completed - - - - - - -    Fall Risk Fall Risk  08/22/2021 04/24/2021 10/18/2020 06/06/2020 04/19/2020  Falls in the past year? 0 0 0 0 0  Number falls in past yr: - 0 0 0 0  Injury with Fall? - 0 0 0 0  Risk for fall due to : - - No Fall Risks No Fall Risks -  Follow up - - Falls evaluation completed Falls evaluation completed Falls evaluation completed    [place]:  Any stairs in or around the home? No  If so, are there any without handrails? No  Home free of loose throw rugs in  walkways, pet beds, electrical cords, etc? Yes  Adequate lighting in your home to reduce risk of falls? Yes   ASSISTIVE DEVICES UTILIZED TO PREVENT FALLS:  Life alert? No  Use of a cane, walker or w/c? No  Grab bars in the bathroom? Yes  Shower chair or bench in shower? No  Elevated toilet seat or a handicapped toilet? No   TIMED UP AND GO:  Was the test performed? No   Cognitive Function:     6CIT Screen 08/22/2021 06/06/2020 05/27/2019 05/25/2018  What Year? 0 points 0 points 0 points 0 points  What month? 0 points 0 points 0 points 0 points  What time? 0 points 0 points 0 points 0 points  Count  back from 20 0 points 0 points 0 points 0 points  Months in reverse 0 points 0 points 0 points 0 points  Repeat phrase 0 points 0 points 2 points 0 points  Total Score 0 0 2 0    Immunizations Immunization History  Administered Date(s) Administered   Influenza,inj,Quad PF,6+ Mos 05/18/2013, 04/14/2014, 04/24/2015, 05/07/2016, 05/14/2017, 03/15/2018, 04/23/2020, 04/24/2021   PPD Test 07/13/2017, 10/13/2017   Tdap 12/06/2012    TDAP status: Up to date  Flu Vaccine status: Up to date   Covid-19 vaccine status: Information provided on how to obtain vaccines.   Qualifies for Shingles Vaccine? No   Zostavax completed No   Shingrix Completed?: No.    Education has been provided regarding the importance of this vaccine. Patient has been advised to call insurance company to determine out of pocket expense if they have not yet received this vaccine. Advised may also receive vaccine at local pharmacy or Health Dept. Verbalized acceptance and understanding.  Screening Tests Health Maintenance  Topic Date Due   COVID-19 Vaccine (1) 10/18/2021 (Originally 08/01/1981)   TETANUS/TDAP  12/07/2022   INFLUENZA VACCINE  Completed   Hepatitis C Screening  Completed   HIV Screening  Completed   HPV VACCINES  Aged Out    Health Maintenance  There are no preventive care reminders to display  for this patient.  Does not qualify for Colonoscopy  Lung Cancer Screening: (Low Dose CT Chest recommended if Age 33-80 years, 30 pack-year currently smoking OR have quit w/in 15years.) does not qualify.    Additional Screening:  Hepatitis C Screening: does not qualify; Completed 10/18/20  Vision Screening: Recommended annual ophthalmology exams for early detection of glaucoma and other disorders of the eye. Is the patient up to date with their annual eye exam?  Yes  Who is the provider or what is the name of the office in which the patient attends annual eye exams?  Doesn't recall If pt is not established with a provider, would they like to be referred to a provider to establish care? No .   Dental Screening: Recommended annual dental exams for proper oral hygiene  Community Resource Referral / Chronic Care Management: CRR required this visit?  No   CCM required this visit?  No      Plan:     I have personally reviewed and noted the following in the patients chart:   Medical and social history Use of alcohol, tobacco or illicit drugs  Current medications and supplements including opioid prescriptions. Patient is not currently taking opioid prescriptions. Functional ability and status Nutritional status Physical activity Advanced directives List of other physicians Hospitalizations, surgeries, and ER visits in previous 12 months Vitals Screenings to include cognitive, depression, and falls Referrals and appointments  In addition, I have reviewed and discussed with patient certain preventive protocols, quality metrics, and best practice recommendations. A written personalized care plan for preventive services as well as general preventive health recommendations were provided to patient.     Gilbert Cohen, CMA   08/22/2021   Nurse Notes:  Gilbert Cohen , Thank you for taking time to come for your Medicare Wellness Visit. I appreciate your ongoing commitment to your  health goals. Please review the following plan we discussed and let me know if I can assist you in the future.   These are the goals we discussed:  Goals      Increase physical activity     Social and Functional Skills Optimized  Wants to form a relationship (get a girlfriend).         This is a list of the screening recommended for you and due dates:  Health Maintenance  Topic Date Due   COVID-19 Vaccine (1) 10/18/2021*   Tetanus Vaccine  12/07/2022   Flu Shot  Completed   Hepatitis C Screening: USPSTF Recommendation to screen - Ages 18-79 yo.  Completed   HIV Screening  Completed   HPV Vaccine  Aged Out  *Topic was postponed. The date shown is not the original due date.

## 2021-08-27 ENCOUNTER — Ambulatory Visit (INDEPENDENT_AMBULATORY_CARE_PROVIDER_SITE_OTHER): Payer: Medicare Other | Admitting: Licensed Clinical Social Worker

## 2021-08-27 ENCOUNTER — Other Ambulatory Visit: Payer: Self-pay

## 2021-08-27 DIAGNOSIS — F32A Depression, unspecified: Secondary | ICD-10-CM

## 2021-08-27 DIAGNOSIS — F840 Autistic disorder: Secondary | ICD-10-CM

## 2021-08-28 ENCOUNTER — Telehealth (INDEPENDENT_AMBULATORY_CARE_PROVIDER_SITE_OTHER): Payer: Medicare Other | Admitting: Licensed Clinical Social Worker

## 2021-08-28 DIAGNOSIS — F32A Depression, unspecified: Secondary | ICD-10-CM

## 2021-08-28 DIAGNOSIS — F840 Autistic disorder: Secondary | ICD-10-CM

## 2021-08-28 NOTE — BH Specialist Note (Signed)
Alex did not answer.  Left encouraging message.  Writer contacted additional number on file and was able to discuss with caregiver his appt time. Caregiver informed Writer that Patient has a therapist that he has in person visits with.

## 2021-08-28 NOTE — BH Specialist Note (Unsigned)
Virtual Behavioral Health Treatment Plan Team Note  MRN: [id number] NAME: Gilbert Cohen  DATE: 08/28/21  Start time: 36  End time:   Total time:    Total number of [facility] [place] Treatment Team Plan encounters: {Virtual BH number of Tx Plan Encounters:[id number]}  Treatment Team Attendees: ***  Diagnoses: No diagnosis found.  Goals, Interventions and Follow-up Plan Goals: Increase healthy adjustment to current life circumstances Interventions: Mindfulness or Relaxation Training Medication Management Recommendations: n/a. He will be followed by [facility] and we will sign off. Follow-up Plan: Will follow up  History of the present illness Presenting Problem/Current Symptoms: ***  Psychiatric History  Depression: {BHH YES OR NO:22294} Anxiety: {BHH YES OR NO:22294} Mania: {BHH YES OR NO:22294} Psychosis: {BHH YES OR NO:22294} PTSD symptoms: {BHH YES OR NO:22294}  Past Psychiatric History/Hospitalization(s): Hospitalization for psychiatric illness: {BHH YES OR NO:22294} Prior Suicide Attempts: {BHH YES OR NO:22294} Prior Self-injurious behavior: {BHH YES OR [id number]  Psychosocial stressors   Self-harm Behaviors Risk Assessment   Screenings PHQ-9 Assessments:  Depression screen [facility] 2/9 08/22/2021 08/22/2021 10/18/2020  Decreased Interest 0 0 0  Down, Depressed, Hopeless 0 0 0  PHQ - 2 Score 0 0 0   GAD-7 Assessments: No flowsheet data found.  Past Medical History Past Medical History:  Diagnosis Date   Autistic spectrum disorder    Hyperlipidemia    Intellectual disability    Mental retardation 1982   hypoxic brain injury at birth    Vital signs: There were no vitals filed for this visit.  Allergies:  Allergies as of 08/28/2021   (No Known Allergies)    Medication History Current medications:  Outpatient Encounter Medications as of 08/28/2021  Medication Sig   citalopram (CELEXA) 40 MG tablet TAKE ONE TABLET EVERY MORNING   montelukast (SINGULAIR) 10 MG  tablet TAKE ONE TABLET BY MOUTH AT BEDTIME   Omega-3 Fatty Acids (FISH OIL) 1000 MG CAPS Two daily   rosuvastatin (CRESTOR) 10 MG tablet Take 1 tablet (10 mg total) by mouth daily.   No facility-administered encounter medications on file as of 08/28/2021.     Scribe for Treatment Team: Lubertha South, LCSW

## 2021-10-23 ENCOUNTER — Ambulatory Visit: Payer: Medicare Other | Admitting: Family Medicine

## 2021-11-13 ENCOUNTER — Encounter: Payer: Self-pay | Admitting: Family Medicine

## 2021-11-13 ENCOUNTER — Ambulatory Visit (INDEPENDENT_AMBULATORY_CARE_PROVIDER_SITE_OTHER): Payer: Medicare Other | Admitting: Family Medicine

## 2021-11-13 VITALS — BP 120/70 | HR 64 | Ht 67.0 in | Wt 216.1 lb

## 2021-11-13 DIAGNOSIS — F32A Depression, unspecified: Secondary | ICD-10-CM | POA: Diagnosis not present

## 2021-11-13 DIAGNOSIS — E785 Hyperlipidemia, unspecified: Secondary | ICD-10-CM

## 2021-11-13 DIAGNOSIS — E559 Vitamin D deficiency, unspecified: Secondary | ICD-10-CM

## 2021-11-13 DIAGNOSIS — R7303 Prediabetes: Secondary | ICD-10-CM | POA: Diagnosis not present

## 2021-11-13 DIAGNOSIS — R7301 Impaired fasting glucose: Secondary | ICD-10-CM

## 2021-11-13 DIAGNOSIS — E669 Obesity, unspecified: Secondary | ICD-10-CM

## 2021-11-13 DIAGNOSIS — I10 Essential (primary) hypertension: Secondary | ICD-10-CM

## 2021-11-13 NOTE — Patient Instructions (Addendum)
F/U in 6 months, flu vaccine at visit,  call if you need me sooner ? ?Labs today, CBC, lipid, cmp and EGFR, tSH , HBA1C and vit d ? ?You have lost 14 pounds in the past year which is good, and your blood pressure is good ? ?It is important that you exercise regularly at least 30 minutes 5 times a week. If you develop chest pain, have severe difficulty breathing, or feel very tired, stop exercising immediately and seek medical attention  ? ? ?Pease eat more vegetables and fruit, cut back on sweets, drink water  ? ?Thanks for choosing [facility], we consider it a privelige to serve you. ? ? ? ? ?

## 2021-11-14 ENCOUNTER — Encounter: Payer: Self-pay | Admitting: Family Medicine

## 2021-11-14 ENCOUNTER — Other Ambulatory Visit: Payer: Self-pay | Admitting: Family Medicine

## 2021-11-14 DIAGNOSIS — E559 Vitamin D deficiency, unspecified: Secondary | ICD-10-CM | POA: Insufficient documentation

## 2021-11-14 LAB — LIPID PANEL
Chol/HDL Ratio: 2.8 ratio (ref 0.0–5.0)
Cholesterol, Total: 104 mg/dL (ref 100–199)
HDL: 37 mg/dL — ABNORMAL LOW (ref >39)
LDL Chol Calc (NIH): 51 mg/dL (ref 0–99)
Triglycerides: 79 mg/dL (ref 0–149)
VLDL Cholesterol Cal: 16 mg/dL (ref 5–40)

## 2021-11-14 LAB — HEMOGLOBIN A1C
Est. average glucose Bld gHb Est-mCnc: 117 mg/dL
Hgb A1c MFr Bld: 5.7 % — ABNORMAL HIGH (ref 4.8–5.6)

## 2021-11-14 LAB — CMP14+EGFR
ALT: 20 IU/L (ref 0–44)
AST: 23 IU/L (ref 0–40)
Albumin/Globulin Ratio: 2.6 — ABNORMAL HIGH (ref 1.2–2.2)
Albumin: 4.9 g/dL (ref 4.0–5.0)
Alkaline Phosphatase: 63 IU/L (ref 44–121)
BUN/Creatinine Ratio: 13 (ref 9–20)
BUN: 13 mg/dL (ref 6–24)
Bilirubin Total: 0.7 mg/dL (ref 0.0–1.2)
CO2: 26 mmol/L (ref 20–29)
Calcium: 9.6 mg/dL (ref 8.7–10.2)
Chloride: 102 mmol/L (ref 96–106)
Creatinine, Ser: 1.04 mg/dL (ref 0.76–1.27)
Globulin, Total: 1.9 g/dL (ref 1.5–4.5)
Glucose: 54 mg/dL — ABNORMAL LOW (ref 70–99)
Potassium: 4.3 mmol/L (ref 3.5–5.2)
Sodium: 140 mmol/L (ref 134–144)
Total Protein: 6.8 g/dL (ref 6.0–8.5)
eGFR: 93 mL/min/{1.73_m2} (ref >59)

## 2021-11-14 LAB — CBC
Hematocrit: 45 % (ref 37.5–51.0)
Hemoglobin: 14.6 g/dL (ref 13.0–17.7)
MCH: 26.5 pg — ABNORMAL LOW (ref 26.6–33.0)
MCHC: 32.4 g/dL (ref 31.5–35.7)
MCV: 82 fL (ref 79–97)
Platelets: 179 10*3/uL (ref 150–450)
RBC: 5.5 x10E6/uL (ref 4.14–5.80)
RDW: 14.2 % (ref 11.6–15.4)
WBC: 3.6 10*3/uL (ref 3.4–10.8)

## 2021-11-14 LAB — TSH: TSH: 1.36 u[IU]/mL (ref 0.450–4.500)

## 2021-11-14 LAB — VITAMIN D 25 HYDROXY (VIT D DEFICIENCY, FRACTURES): Vit D, 25-Hydroxy: 14.8 ng/mL — ABNORMAL LOW (ref 30.0–100.0)

## 2021-11-14 MED ORDER — ERGOCALCIFEROL 1.25 MG (50000 UT) PO CAPS
50000.0000 [IU] | ORAL_CAPSULE | ORAL | 2 refills | Status: DC
Start: 2021-11-14 — End: 2022-04-29

## 2021-11-14 NOTE — Assessment & Plan Note (Addendum)
Patient educated about the importance of limiting  Carbohydrate intake , the need to commit to daily physical activity for a minimum of 30 minutes , and to commit weight loss. ?The fact that changes in all these areas will reduce or eliminate all together the development of diabetes is stressed.  ? ? ?  Latest Ref Rng & Units 11/13/2021  ?  4:26 PM 10/18/2020  ?  4:38 PM 11/22/2019  ? 10:09 AM 12/08/2018  ?  2:15 PM 01/13/2018  ? 12:16 PM  ?Diabetic Labs  ?HbA1c 4.8 - 5.6 % 5.7   6.2   5.7      ?Chol 100 - 199 mg/dL 332   951   884      ?HDL >39 mg/dL 37   31   33      ?Calc LDL 0 - 99 mg/dL 51   166   063      ?Triglycerides 0 - 149 mg/dL 79   016   010      ?Creatinine 0.76 - 1.27 mg/dL 9.32   3.55   7.32   2.02   1.12    ?GFR >60.00 mL/min     94.90    ? ? ?  11/13/2021  ?  4:15 PM 11/13/2021  ?  3:47 PM 08/22/2021  ?  4:27 PM 04/24/2021  ?  4:32 PM 10/18/2020  ?  3:53 PM 04/19/2020  ?  4:03 PM 10/18/2019  ?  3:58 PM  ?BP/Weight  ?Systolic BP 120 131  123 132 136 110  ?Diastolic BP 70 75  72 88 80 58  ?Wt. (Lbs)  216.12 227 227.4 230 234 209  ?BMI  33.85 kg/m2 35.55 kg/m2 35.62 kg/m2 36.02 kg/m2 36.65 kg/m2 32.73 kg/m2  ? ?   ? View : No data to display.  ?  ?  ?  ?improved ? ? ?

## 2021-11-14 NOTE — Assessment & Plan Note (Signed)
Improved ? ?Patient re-educated about  the importance of commitment to a  minimum of 150 minutes of exercise per week as able. ? ?The importance of healthy food choices with portion control discussed, as well as eating regularly and within a 12 hour window most days. ?The need to choose "clean , green" food 50 to 75% of the time is discussed, as well as to make water the primary drink and set a goal of 64 ounces water daily. ? ?  ? ?  11/13/2021  ?  3:47 PM 08/22/2021  ?  4:27 PM 04/24/2021  ?  4:32 PM  ?Weight /BMI  ?Weight 216 lb 1.9 oz 227 lb 227 lb 6.4 oz  ?Height 5\' 7"  (1.702 m)  5\' 7"  (1.702 m)  ?BMI 33.85 kg/m2 35.55 kg/m2 35.62 kg/m2  ? ? ? ?

## 2021-11-14 NOTE — Progress Notes (Signed)
? ?Gilbert Cohen     MRN: [id number]      DOB: 1980/07/14 ? ? ?HPI ?No concerns from caregiver, patient denies all concerns when questioned, feels well ?Gilbert Cohen is here for follow up and re-evaluation of chronic medical conditions, medication management and review of any available recent lab and radiology data.  ?Preventive health is updated, specifically  Cancer screening and Immunization.   ?The PT denies any adverse reactions to current medications since the last visit.  ?There are no new concerns.  ?There are no specific complaints  ? ?ROS ?Denies recent fever or chills. ?Denies sinus pressure, nasal congestion, ear pain or sore throat. ?Denies chest congestion, productive cough or wheezing. ?Denies chest pains, palpitations and leg swelling ?Denies abdominal pain, nausea, vomiting,diarrhea or constipation.   ?Denies dysuria, frequency, hesitancy or incontinence. ?Denies joint pain, swelling and limitation in mobility. ?Denies headaches, seizures, numbness, or tingling. ?Denies depression, anxiety or insomnia. ?Denies skin break down or rash. ? ? ?PE ? ?BP 120/70   Pulse 64   Ht 5\' 7"  (1.702 m)   Wt 216 lb 1.9 oz (98 kg)   SpO2 97%   BMI 33.85 kg/m?  ? ?Patient alert and oriented and in no cardiopulmonary distress. ? ?HEENT: No facial asymmetry, EOMI,     Neck supple . ? ?Chest: Clear to auscultation bilaterally. ? ?CVS: S1, S2 no murmurs, no S3.Regular rate. ? ?ABD: Soft non tender.  ? ?Ext: No edema ? ?MS: Adequate ROM spine, shoulders, hips and knees. ? ?Skin: Intact, no ulcerations or rash noted. ? ?Psych: Good eye contact, normal affect. Memory intact not anxious or depressed appearing. ? ?CNS: CN 2-12 intact, power,  normal throughout.no focal deficits noted. ? ? ?Assessment & Plan ? ?Dyslipidemia ?Hyperlipidemia:Low fat diet discussed and encouraged. ? ? ?Lipid Panel  ?Lab Results  ?Component Value Date  ? CHOL 104 11/13/2021  ? HDL 37 (L) 11/13/2021  ? LDLCALC 51 11/13/2021  ? TRIG 79  11/13/2021  ? CHOLHDL 2.8 11/13/2021  ? ?Improved, stay on med and low fat diet ? ? ? ?Prediabetes ?Patient educated about the importance of limiting  Carbohydrate intake , the need to commit to daily physical activity for a minimum of 30 minutes , and to commit weight loss. ?The fact that changes in all these areas will reduce or eliminate all together the development of diabetes is stressed.  ? ? ?  Latest Ref Rng & Units 11/13/2021  ?  4:26 PM 10/18/2020  ?  4:38 PM 11/22/2019  ? 10:09 AM 12/08/2018  ?  2:15 PM 01/13/2018  ? 12:16 PM  ?Diabetic Labs  ?HbA1c 4.8 - 5.6 % 5.7   6.2   5.7      ?Chol 100 - 199 mg/dL 03/16/2018   694   854      ?HDL >39 mg/dL 37   31   33      ?Calc LDL 0 - 99 mg/dL 51   627   035      ?Triglycerides 0 - 149 mg/dL 79   009   381      ?Creatinine 0.76 - 1.27 mg/dL 829   9.37   1.69   6.78   1.12    ?GFR >60.00 mL/min     94.90    ? ? ?  11/13/2021  ?  4:15 PM 11/13/2021  ?  3:47 PM 08/22/2021  ?  4:27 PM 04/24/2021  ?  4:32 PM 10/18/2020  ?  3:53 PM 04/19/2020  ?  4:03 PM 10/18/2019  ?  3:58 PM  ?BP/Weight  ?Systolic BP 120 131  123 132 136 110  ?Diastolic BP 70 75  72 88 80 58  ?Wt. (Lbs)  216.12 227 227.4 230 234 209  ?BMI  33.85 kg/m2 35.55 kg/m2 35.62 kg/m2 36.02 kg/m2 36.65 kg/m2 32.73 kg/m2  ? ?   ? View : No data to display.  ?  ?  ?  ?improved ? ? ? ?Obesity (BMI 30.0-34.9) ?Improved ? ?Patient re-educated about  the importance of commitment to a  minimum of 150 minutes of exercise per week as able. ? ?The importance of healthy food choices with portion control discussed, as well as eating regularly and within a 12 hour window most days. ?The need to choose "clean , green" food 50 to 75% of the time is discussed, as well as to make water the primary drink and set a goal of 64 ounces water daily. ? ?  ? ?  11/13/2021  ?  3:47 PM 08/22/2021  ?  4:27 PM 04/24/2021  ?  4:32 PM  ?Weight /BMI  ?Weight 216 lb 1.9 oz 227 lb 227 lb 6.4 oz  ?Height 5\' 7"  (1.702 m)  5\' 7"  (1.702 m)  ?BMI 33.85 kg/m2 35.55  kg/m2 35.62 kg/m2  ? ? ? ? ?Depression ?Controlled, no change in medication ?Managed by psych ? ?Vitamin D deficiency ?Needs weekly supplement ? ?

## 2021-11-14 NOTE — Assessment & Plan Note (Signed)
Needs weekly supplement 

## 2021-11-14 NOTE — Assessment & Plan Note (Signed)
Controlled, no change in medication Managed by psych 

## 2021-11-14 NOTE — Assessment & Plan Note (Signed)
Hyperlipidemia:Low fat diet discussed and encouraged. ? ? ?Lipid Panel  ?Lab Results  ?Component Value Date  ? CHOL 104 11/13/2021  ? HDL 37 (L) 11/13/2021  ? LDLCALC 51 11/13/2021  ? TRIG 79 11/13/2021  ? CHOLHDL 2.8 11/13/2021  ? ?Improved, stay on med and low fat diet ? ? ?

## 2021-11-15 ENCOUNTER — Encounter: Payer: Self-pay | Admitting: Family Medicine

## 2021-12-09 ENCOUNTER — Other Ambulatory Visit: Payer: Self-pay | Admitting: Family Medicine

## 2022-01-25 ENCOUNTER — Other Ambulatory Visit: Payer: Self-pay | Admitting: Family Medicine

## 2022-02-26 ENCOUNTER — Other Ambulatory Visit: Payer: Self-pay

## 2022-02-26 ENCOUNTER — Telehealth: Payer: Self-pay | Admitting: Family Medicine

## 2022-02-26 MED ORDER — MONTELUKAST SODIUM 10 MG PO TABS: 10.0000 mg | ORAL_TABLET | Freq: Every day | ORAL | 3 refills | Status: DC

## 2022-02-26 MED ORDER — CITALOPRAM HYDROBROMIDE 40 MG PO TABS: 40.0000 mg | ORAL_TABLET | Freq: Every morning | ORAL | 3 refills | Status: DC

## 2022-02-26 NOTE — Telephone Encounter (Signed)
Med refilled.

## 2022-02-26 NOTE — Telephone Encounter (Signed)
Pt called stating needing a refill montelukast (SINGULAIR) 10 MG tablet and citalopram (CELEXA) 40 MG tablet. Can you please refill?   [facility] [address] [place]

## 2022-04-28 ENCOUNTER — Other Ambulatory Visit: Payer: Self-pay | Admitting: Family Medicine

## 2022-05-16 ENCOUNTER — Ambulatory Visit (INDEPENDENT_AMBULATORY_CARE_PROVIDER_SITE_OTHER): Payer: Medicare Other | Admitting: Family Medicine

## 2022-05-16 ENCOUNTER — Encounter: Payer: Self-pay | Admitting: Family Medicine

## 2022-05-16 ENCOUNTER — Ambulatory Visit: Payer: Medicare Other | Admitting: Family Medicine

## 2022-05-16 VITALS — BP 140/88 | HR 71 | Ht 67.0 in | Wt 223.0 lb

## 2022-05-16 DIAGNOSIS — F840 Autistic disorder: Secondary | ICD-10-CM

## 2022-05-16 DIAGNOSIS — R7303 Prediabetes: Secondary | ICD-10-CM

## 2022-05-16 DIAGNOSIS — E785 Hyperlipidemia, unspecified: Secondary | ICD-10-CM

## 2022-05-16 DIAGNOSIS — E669 Obesity, unspecified: Secondary | ICD-10-CM | POA: Diagnosis not present

## 2022-05-16 DIAGNOSIS — F32A Depression, unspecified: Secondary | ICD-10-CM

## 2022-05-16 DIAGNOSIS — I10 Essential (primary) hypertension: Secondary | ICD-10-CM

## 2022-05-16 MED ORDER — AMLODIPINE BESYLATE 2.5 MG PO TABS: 2.5000 mg | ORAL_TABLET | Freq: Every day | ORAL | 4 refills | Status: DC

## 2022-05-16 NOTE — Assessment & Plan Note (Signed)
Hyperlipidemia:Low fat diet discussed and encouraged.   Lipid Panel  Lab Results  Component Value Date   CHOL 104 11/13/2021   HDL 37 (L) 11/13/2021   LDLCALC 51 11/13/2021   TRIG 79 11/13/2021   CHOLHDL 2.8 11/13/2021     Updated lab needed at/ before next visit.

## 2022-05-16 NOTE — Assessment & Plan Note (Signed)
  Patient re-educated about  the importance of commitment to a  minimum of 150 minutes of exercise per week as able.  The importance of healthy food choices with portion control discussed, as well as eating regularly and within a 12 hour window most days. The need to choose "clean , green" food 50 to 75% of the time is discussed, as well as to make water the primary drink and set a goal of 64 ounces water daily.       05/16/2022    8:31 AM 11/13/2021    3:47 PM 08/22/2021    4:27 PM  Weight /BMI  Weight 223 lb 216 lb 1.9 oz 227 lb  Height 5\' 7"  (1.702 m) 5\' 7"  (1.702 m)   BMI 34.93 kg/m2 33.85 kg/m2 35.55 kg/m2

## 2022-05-16 NOTE — Assessment & Plan Note (Signed)
Stable , improved level of function noted. Foms to be completed for transport access for special needs

## 2022-05-16 NOTE — Assessment & Plan Note (Signed)
Patient educated about the importance of limiting  Carbohydrate intake , the need to commit to daily physical activity for a minimum of 30 minutes , and to commit weight loss. The fact that changes in all these areas will reduce or eliminate all together the development of diabetes is stressed.      Latest Ref Rng & Units 11/13/2021    4:26 PM 10/18/2020    4:38 PM 11/22/2019   10:09 AM 12/08/2018    2:15 PM 01/13/2018   12:16 PM  Diabetic Labs  HbA1c 4.8 - 5.6 % 5.7  6.2  5.7     Chol 100 - 199 mg/dL 580  998  338     HDL >25 mg/dL 37  31  33     Calc LDL 0 - 99 mg/dL 51  053  976     Triglycerides 0 - 149 mg/dL 79  734  193     Creatinine 0.76 - 1.27 mg/dL 7.90  2.40  9.73  5.32  1.12   GFR >60.00 mL/min     94.90       05/16/2022    8:31 AM 11/13/2021    4:15 PM 11/13/2021    3:47 PM 08/22/2021    4:27 PM 04/24/2021    4:32 PM 10/18/2020    3:53 PM 04/19/2020    4:03 PM  BP/Weight  Systolic BP 136 120 131  123 132 136  Diastolic BP 88 70 75  72 88 80  Wt. (Lbs) 223  216.12 227 227.4 230 234  BMI 34.93 kg/m2  33.85 kg/m2 35.55 kg/m2 35.62 kg/m2 36.02 kg/m2 36.65 kg/m2       No data to display          Updated lab needed at/ before next visit.

## 2022-05-16 NOTE — Assessment & Plan Note (Addendum)
DASH diet and commitment to daily physical activity for a minimum of 30 minutes discussed and encouraged, as a part of hypertension management. The importance of attaining a healthy weight is also discussed.     05/16/2022   11:25 PM 05/16/2022    8:31 AM 11/13/2021    4:15 PM 11/13/2021    3:47 PM 08/22/2021    4:27 PM 04/24/2021    4:32 PM 10/18/2020    3:53 PM  BP/Weight  Systolic BP 140 136 120 131  123 132  Diastolic BP 88 88 70 75  72 88  Wt. (Lbs)  223  216.12 227 227.4 230  BMI  34.93 kg/m2  33.85 kg/m2 35.55 kg/m2 35.62 kg/m2 36.02 kg/m2         Start amlodipine 2.5 mg daily, re eval in 2 months

## 2022-05-16 NOTE — Progress Notes (Signed)
Gilbert Cohen     MRN: [id number]      DOB: 18-Apr-1981   HPI Gilbert Cohen is here for follow up and re-evaluation of chronic medical conditions, medication management and review of any available recent lab and radiology data.  Preventive health is updated, specifically  Cancer screening and Immunization.  States has had both the flu and covid vaccines, card recording he latter is available, unable to confirm flu The PT denies any adverse reactions to current medications since the last visit.  There are no new concerns.  There are no specific complaints  Has been living with his Mother once more, prefers to be there,  and his father has recently re entered his life He is more verbal, he works at [employer] which is in walking distance from his home, requests paperwork be completed so that he can have access to specialized  transport for the disabled  ROS Denies recent fever or chills. Denies sinus pressure, nasal congestion, ear pain or sore throat. Denies chest congestion, productive cough or wheezing. Denies chest pains, palpitations and leg swelling Denies abdominal pain, nausea, vomiting,diarrhea or constipation.   Denies dysuria, frequency, hesitancy or incontinence. Denies joint pain, swelling and limitation in mobility. Denies headaches, seizures, numbness, or tingling. Denies depression, anxiety or insomnia. Denies skin break down or rash.   PE  BP (!) 140/88   Pulse 71   Ht 5\' 7"  (1.702 m)   Wt 223 lb (101.2 kg)   SpO2 93%   BMI 34.93 kg/m    Patient alert and oriented and in no cardiopulmonary distress.  HEENT: No facial asymmetry, EOMI,     Neck supple .  Chest: Clear to auscultation bilaterally.  CVS: S1, S2 no murmurs, no S3.Regular rate.  ABD: Soft non tender.   Ext: No edema  MS: Adequate ROM spine, shoulders, hips and knees.  Skin: Intact, no ulcerations or rash noted.  Psych: Good eye contact, normal affect. Memory intact not anxious or  depressed appearing.  CNS: CN 2-12 intact, power,  normal throughout.no focal deficits noted.   Assessment & Plan  Prediabetes Patient educated about the importance of limiting  Carbohydrate intake , the need to commit to daily physical activity for a minimum of 30 minutes , and to commit weight loss. The fact that changes in all these areas will reduce or eliminate all together the development of diabetes is stressed.      Latest Ref Rng & Units 11/13/2021    4:26 PM 10/18/2020    4:38 PM 11/22/2019   10:09 AM 12/08/2018    2:15 PM 01/13/2018   12:16 PM  Diabetic Labs  HbA1c 4.8 - 5.6 % 5.7  6.2  5.7     Chol 100 - 199 mg/dL 03/16/2018  627  035     HDL 009 mg/dL 37  31  33     Calc LDL 0 - 99 mg/dL 51  >38  182     Triglycerides 0 - 149 mg/dL 79  993  716     Creatinine 0.76 - 1.27 mg/dL 967  8.93  8.10  1.75  1.12   GFR >60.00 mL/min     94.90       05/16/2022    8:31 AM 11/13/2021    4:15 PM 11/13/2021    3:47 PM 08/22/2021    4:27 PM 04/24/2021    4:32 PM 10/18/2020    3:53 PM 04/19/2020    4:03 PM  BP/Weight  Systolic BP 136 120 131  123 132 136  Diastolic BP 88 70 75  72 88 80  Wt. (Lbs) 223  216.12 227 227.4 230 234  BMI 34.93 kg/m2  33.85 kg/m2 35.55 kg/m2 35.62 kg/m2 36.02 kg/m2 36.65 kg/m2       No data to display          Updated lab needed at/ before next visit.   Obesity (BMI 30.0-34.9)  Patient re-educated about  the importance of commitment to a  minimum of 150 minutes of exercise per week as able.  The importance of healthy food choices with portion control discussed, as well as eating regularly and within a 12 hour window most days. The need to choose "clean , green" food 50 to 75% of the time is discussed, as well as to make water the primary drink and set a goal of 64 ounces water daily.       05/16/2022    8:31 AM 11/13/2021    3:47 PM 08/22/2021    4:27 PM  Weight /BMI  Weight 223 lb 216 lb 1.9 oz 227 lb  Height 5\' 7"  (1.702 m) 5\' 7"  (1.702 m)    BMI 34.93 kg/m2 33.85 kg/m2 35.55 kg/m2      Dyslipidemia Hyperlipidemia:Low fat diet discussed and encouraged.   Lipid Panel  Lab Results  Component Value Date   CHOL 104 11/13/2021   HDL 37 (L) 11/13/2021   LDLCALC 51 11/13/2021   TRIG 79 11/13/2021   CHOLHDL 2.8 11/13/2021     Updated lab needed at/ before next visit.   Autism spectrum disorder Stable , improved level of function noted. Foms to be completed for transport access for special needs  Depression Controlled, no change in medication No longer seeing mental health and does not want to return to Psych or therapy  Essential hypertension DASH diet and commitment to daily physical activity for a minimum of 30 minutes discussed and encouraged, as a part of hypertension management. The importance of attaining a healthy weight is also discussed.     05/16/2022    8:31 AM 11/13/2021    4:15 PM 11/13/2021    3:47 PM 08/22/2021    4:27 PM 04/24/2021    4:32 PM 10/18/2020    3:53 PM 04/19/2020    4:03 PM  BP/Weight  Systolic BP 136 120 131  123 132 136  Diastolic BP 88 70 75  72 88 80  Wt. (Lbs) 223  216.12 227 227.4 230 234  BMI 34.93 kg/m2  33.85 kg/m2 35.55 kg/m2 35.62 kg/m2 36.02 kg/m2 36.65 kg/m2     Start amlodipine 2.5 mg daily, re eval in 2 months

## 2022-05-16 NOTE — Patient Instructions (Addendum)
F/U in 2 months, re evaluate blood pressure and weight   Pleased bring your medications to the next visit  Flu vaccine today if not already given , we are checking the pharmacy  New for blood pressure is amlodipine 2.5 mg one daily Lipid, cmp and EGFr, HBA1C today  ,It is important that you exercise regularly at least 30 minutes 5 times a week. 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

## 2022-05-16 NOTE — Assessment & Plan Note (Signed)
Controlled, no change in medication No longer seeing mental health and does not want to return to Psych or therapy

## 2022-05-17 LAB — LIPID PANEL
Chol/HDL Ratio: 4 ratio (ref 0.0–5.0)
Cholesterol, Total: 141 mg/dL (ref 100–199)
HDL: 35 mg/dL — ABNORMAL LOW (ref >39)
LDL Chol Calc (NIH): 81 mg/dL (ref 0–99)
Triglycerides: 142 mg/dL (ref 0–149)
VLDL Cholesterol Cal: 25 mg/dL (ref 5–40)

## 2022-05-17 LAB — CMP14+EGFR
ALT: 27 IU/L (ref 0–44)
AST: 30 IU/L (ref 0–40)
Albumin/Globulin Ratio: 2.3 — ABNORMAL HIGH (ref 1.2–2.2)
Albumin: 4.8 g/dL (ref 4.1–5.1)
Alkaline Phosphatase: 66 IU/L (ref 44–121)
BUN/Creatinine Ratio: 8 — ABNORMAL LOW (ref 9–20)
BUN: 9 mg/dL (ref 6–24)
Bilirubin Total: 0.9 mg/dL (ref 0.0–1.2)
CO2: 25 mmol/L (ref 20–29)
Calcium: 9.3 mg/dL (ref 8.7–10.2)
Chloride: 103 mmol/L (ref 96–106)
Creatinine, Ser: 1.15 mg/dL (ref 0.76–1.27)
Globulin, Total: 2.1 g/dL (ref 1.5–4.5)
Glucose: 99 mg/dL (ref 70–99)
Potassium: 3.9 mmol/L (ref 3.5–5.2)
Sodium: 142 mmol/L (ref 134–144)
Total Protein: 6.9 g/dL (ref 6.0–8.5)
eGFR: 82 mL/min/{1.73_m2} (ref >59)

## 2022-05-17 LAB — HEMOGLOBIN A1C
Est. average glucose Bld gHb Est-mCnc: 120 mg/dL
Hgb A1c MFr Bld: 5.8 % — ABNORMAL HIGH (ref 4.8–5.6)

## 2022-06-23 ENCOUNTER — Other Ambulatory Visit: Payer: Self-pay | Admitting: Family Medicine

## 2022-07-18 ENCOUNTER — Encounter: Payer: Self-pay | Admitting: Family Medicine

## 2022-07-18 ENCOUNTER — Ambulatory Visit (INDEPENDENT_AMBULATORY_CARE_PROVIDER_SITE_OTHER): Payer: Medicare Other | Admitting: Family Medicine

## 2022-07-18 VITALS — BP 119/74 | HR 66 | Ht 67.0 in | Wt 219.1 lb

## 2022-07-18 DIAGNOSIS — E559 Vitamin D deficiency, unspecified: Secondary | ICD-10-CM

## 2022-07-18 DIAGNOSIS — F32A Depression, unspecified: Secondary | ICD-10-CM

## 2022-07-18 DIAGNOSIS — I10 Essential (primary) hypertension: Secondary | ICD-10-CM | POA: Diagnosis not present

## 2022-07-18 DIAGNOSIS — E669 Obesity, unspecified: Secondary | ICD-10-CM

## 2022-07-18 DIAGNOSIS — E66811 Obesity, class 1: Secondary | ICD-10-CM

## 2022-07-18 DIAGNOSIS — I6782 Cerebral ischemia: Secondary | ICD-10-CM

## 2022-07-18 DIAGNOSIS — R7303 Prediabetes: Secondary | ICD-10-CM | POA: Diagnosis not present

## 2022-07-18 DIAGNOSIS — E785 Hyperlipidemia, unspecified: Secondary | ICD-10-CM | POA: Diagnosis not present

## 2022-07-18 DIAGNOSIS — G931 Anoxic brain damage, not elsewhere classified: Secondary | ICD-10-CM

## 2022-07-18 DIAGNOSIS — F840 Autistic disorder: Secondary | ICD-10-CM

## 2022-07-18 NOTE — Patient Instructions (Addendum)
F/u mid May, call if you need me sooner  Forms completed and returned today  Excellent BP  Fasting lipid, cmp and EGFr and hBA1C , cBC, tSH and vit D, 3 to 5 days before next visit  It is important that you exercise regularly at least 30 minutes 5 times a week. If you develop chest pain, have severe difficulty breathing, or feel very tired, stop exercising immediately and seek medical attention   Thanks for choosing [facility], we consider it a privelige to serve you.

## 2022-07-21 ENCOUNTER — Encounter: Payer: Self-pay | Admitting: Family Medicine

## 2022-07-21 NOTE — Assessment & Plan Note (Signed)
Updated lab needed at/ before next visit.   

## 2022-07-21 NOTE — Assessment & Plan Note (Signed)
Request for special public transport made because of this diagnosis

## 2022-07-21 NOTE — Assessment & Plan Note (Signed)
Form completed at visit requesting access to special public transport because of autism

## 2022-07-21 NOTE — Assessment & Plan Note (Signed)
Updated lab needed at/ before next visit. Patient educated about the importance of limiting  Carbohydrate intake , the need to commit to daily physical activity for a minimum of 30 minutes , and to commit weight loss. The fact that changes in all these areas will reduce or eliminate all together the development of diabetes is stressed.      Latest Ref Rng & Units 05/16/2022    9:34 AM 11/13/2021    4:26 PM 10/18/2020    4:38 PM 11/22/2019   10:09 AM 12/08/2018    2:15 PM  Diabetic Labs  HbA1c 4.8 - 5.6 % 5.8  5.7  6.2  5.7    Chol 100 - 199 mg/dL 141  104  192  202    HDL >39 mg/dL 35  37  31  33    Calc LDL 0 - 99 mg/dL 81  51  130  133    Triglycerides 0 - 149 mg/dL 142  79  171  222    Creatinine 0.76 - 1.27 mg/dL 1.15  1.04  1.10  1.18  1.11       07/18/2022   10:33 AM 05/16/2022   11:25 PM 05/16/2022    8:31 AM 11/13/2021    4:15 PM 11/13/2021    3:47 PM 08/22/2021    4:27 PM 04/24/2021    4:32 PM  BP/Weight  Systolic BP 606 301 601 093 235  573  Diastolic BP 74 88 88 70 75  72  Wt. (Lbs) 219.12  223  216.12 227 227.4  BMI 34.32 kg/m2  34.93 kg/m2  33.85 kg/m2 35.55 kg/m2 35.62 kg/m2       No data to display

## 2022-07-21 NOTE — Assessment & Plan Note (Signed)
Controlled, no change in medication DASH diet and commitment to daily physical activity for a minimum of 30 minutes discussed and encouraged, as a part of hypertension management. The importance of attaining a healthy weight is also discussed.     07/18/2022   10:33 AM 05/16/2022   11:25 PM 05/16/2022    8:31 AM 11/13/2021    4:15 PM 11/13/2021    3:47 PM 08/22/2021    4:27 PM 04/24/2021    4:32 PM  BP/Weight  Systolic BP 427 062 376 283 151  761  Diastolic BP 74 88 88 70 75  72  Wt. (Lbs) 219.12  223  216.12 227 227.4  BMI 34.32 kg/m2  34.93 kg/m2  33.85 kg/m2 35.55 kg/m2 35.62 kg/m2

## 2022-07-21 NOTE — Assessment & Plan Note (Addendum)
Improved Patient re-educated about  the importance of commitment to a  minimum of 150 minutes of exercise per week as able.  The importance of healthy food choices with portion control discussed, as well as eating regularly and within a 12 hour window most days. The need to choose "clean , green" food 50 to 75% of the time is discussed, as well as to make water the primary drink and set a goal of 64 ounces water daily.       07/18/2022   10:33 AM 05/16/2022    8:31 AM 11/13/2021    3:47 PM  Weight /BMI  Weight 219 lb 1.9 oz 223 lb 216 lb 1.9 oz  Height 5\' 7"  (1.702 m) 5\' 7"  (1.702 m) 5\' 7"  (1.702 m)  BMI 34.32 kg/m2 34.93 kg/m2 33.85 kg/m2

## 2022-07-21 NOTE — Progress Notes (Signed)
Gilbert Cohen     MRN: [id number]      DOB: [birth year]   HPI Gilbert Cohen is here for follow up and re-evaluation of chronic medical conditions, medication management and review of any available recent lab and radiology data.  Preventive health is updated, specifically  Cancer screening and Immunization.   eactions to current medications since the last visit.  There are no new concerns.  There are no specific complaints   ROS Denies recent fever or chills. Denies sinus pressure, nasal congestion, ear pain or sore throat. Denies chest congestion, productive cough or wheezing. Denies chest pains, palpitations and leg swelling Denies abdominal pain, nausea, vomiting,diarrhea or constipation.   Denies dysuria, frequency, hesitancy or incontinence. Denies joint pain, swelling and limitation in mobility. Denies headaches, seizures, numbness, or tingling. . Denies skin break down or rash.   PE  BP 119/74 (BP Location: Right Arm, Patient Position: Sitting, Cuff Size: Large)   Pulse 66   Ht 5\' 7"  (1.702 m)   Wt 219 lb 1.9 oz (99.4 kg)   SpO2 95%   BMI 34.32 kg/m   Patient alert and oriented and in no cardiopulmonary distress.  HEENT: No facial asymmetry, EOMI,     Neck supple .  Chest: Clear to auscultation bilaterally.  CVS: S1, S2 no murmurs, no S3.Regular rate.  ABD: Soft non tender.   Ext: No edema  MS: Adequate ROM spine, shoulders, hips and knees.  Skin: Intact, no ulcerations or rash noted.  Psych: Good eye contact, normal affect. not anxious or depressed appearing.  CNS: CN 2-12 intact, power,  normal throughout.no focal deficits noted.   Assessment & Plan  Essential hypertension Controlled, no change in medication DASH diet and commitment to daily physical activity for a minimum of 30 minutes discussed and encouraged, as a part of hypertension management. The importance of attaining a healthy weight is also discussed.     07/18/2022   10:33 AM  05/16/2022   11:25 PM 05/16/2022    8:31 AM 11/13/2021    4:15 PM 11/13/2021    3:47 PM 08/22/2021    4:27 PM 04/24/2021    4:32 PM  BP/Weight  Systolic BP 562 130 865 784 696  295  Diastolic BP 74 88 88 70 75  72  Wt. (Lbs) 219.12  223  216.12 227 227.4  BMI 34.32 kg/m2  34.93 kg/m2  33.85 kg/m2 35.55 kg/m2 35.62 kg/m2       Prediabetes Updated lab needed at/ before next visit. Patient educated about the importance of limiting  Carbohydrate intake , the need to commit to daily physical activity for a minimum of 30 minutes , and to commit weight loss. The fact that changes in all these areas will reduce or eliminate all together the development of diabetes is stressed.      Latest Ref Rng & Units 05/16/2022    9:34 AM 11/13/2021    4:26 PM 10/18/2020    4:38 PM 11/22/2019   10:09 AM 12/08/2018    2:15 PM  Diabetic Labs  HbA1c 4.8 - 5.6 % 5.8  5.7  6.2  5.7    Chol 100 - 199 mg/dL 141  104  192  202    HDL >39 mg/dL 35  37  31  33    Calc LDL 0 - 99 mg/dL 81  51  130  133    Triglycerides 0 - 149 mg/dL 142  79  171  222  Creatinine 0.76 - 1.27 mg/dL 1.15  1.04  1.10  1.18  1.11       07/18/2022   10:33 AM 05/16/2022   11:25 PM 05/16/2022    8:31 AM 11/13/2021    4:15 PM 11/13/2021    3:47 PM 08/22/2021    4:27 PM 04/24/2021    4:32 PM  BP/Weight  Systolic BP 124 580 998 338 250  539  Diastolic BP 74 88 88 70 75  72  Wt. (Lbs) 219.12  223  216.12 227 227.4  BMI 34.32 kg/m2  34.93 kg/m2  33.85 kg/m2 35.55 kg/m2 35.62 kg/m2       No data to display            Dyslipidemia Hyperlipidemia:Low fat diet discussed and encouraged.   Lipid Panel  Lab Results  Component Value Date   CHOL 141 05/16/2022   HDL 35 (L) 05/16/2022   LDLCALC 81 05/16/2022   TRIG 142 05/16/2022   CHOLHDL 4.0 05/16/2022     Needs to increase activity,  Updated lab needed at/ before next visit.   Obesity (BMI 30.0-34.9) Improved Patient re-educated about  the importance of  commitment to a  minimum of 150 minutes of exercise per week as able.  The importance of healthy food choices with portion control discussed, as well as eating regularly and within a 12 hour window most days. The need to choose "clean , green" food 50 to 75% of the time is discussed, as well as to make water the primary drink and set a goal of 64 ounces water daily.       07/18/2022   10:33 AM 05/16/2022    8:31 AM 11/13/2021    3:47 PM  Weight /BMI  Weight 219 lb 1.9 oz 223 lb 216 lb 1.9 oz  Height 5\' 7"  (1.702 m) 5\' 7"  (1.702 m) 5\' 7"  (1.702 m)  BMI 34.32 kg/m2 34.93 kg/m2 33.85 kg/m2      Autism spectrum disorder Form completed at visit requesting access to special public transport because of autism  Vitamin D deficiency Updated lab needed at/ before next visit.   Chronic hypoxic-ischemic brain injury Request for special public transport made because of this diagnosis  Depression Controlled, no change in medication

## 2022-07-21 NOTE — Assessment & Plan Note (Signed)
Hyperlipidemia:Low fat diet discussed and encouraged.   Lipid Panel  Lab Results  Component Value Date   CHOL 141 05/16/2022   HDL 35 (L) 05/16/2022   LDLCALC 81 05/16/2022   TRIG 142 05/16/2022   CHOLHDL 4.0 05/16/2022     Needs to increase activity,  Updated lab needed at/ before next visit.

## 2022-07-21 NOTE — Assessment & Plan Note (Signed)
Controlled, no change in medication  

## 2022-08-25 ENCOUNTER — Encounter: Payer: Self-pay | Admitting: Family Medicine

## 2022-08-25 ENCOUNTER — Ambulatory Visit: Payer: Medicare Other

## 2022-10-18 ENCOUNTER — Other Ambulatory Visit: Payer: Self-pay | Admitting: Family Medicine

## 2022-11-21 ENCOUNTER — Ambulatory Visit: Payer: Medicare Other | Admitting: Family Medicine

## 2022-11-26 ENCOUNTER — Ambulatory Visit (INDEPENDENT_AMBULATORY_CARE_PROVIDER_SITE_OTHER): Payer: Medicare Other | Admitting: Family Medicine

## 2022-11-26 ENCOUNTER — Encounter: Payer: Self-pay | Admitting: Family Medicine

## 2022-11-26 VITALS — BP 116/74 | HR 68 | Ht 67.0 in | Wt 207.1 lb

## 2022-11-26 DIAGNOSIS — E669 Obesity, unspecified: Secondary | ICD-10-CM

## 2022-11-26 DIAGNOSIS — E559 Vitamin D deficiency, unspecified: Secondary | ICD-10-CM

## 2022-11-26 DIAGNOSIS — E785 Hyperlipidemia, unspecified: Secondary | ICD-10-CM

## 2022-11-26 DIAGNOSIS — I10 Essential (primary) hypertension: Secondary | ICD-10-CM | POA: Diagnosis not present

## 2022-11-26 DIAGNOSIS — F89 Unspecified disorder of psychological development: Secondary | ICD-10-CM

## 2022-11-26 DIAGNOSIS — R7303 Prediabetes: Secondary | ICD-10-CM

## 2022-11-26 DIAGNOSIS — F32A Depression, unspecified: Secondary | ICD-10-CM

## 2022-11-26 NOTE — Patient Instructions (Signed)
F/u nas needed  I willlet your Mom know I first recommend her PCP  It has been a pleasure taking care of you  Labs ordered in January today  Thanks for choosing [facility], we consider it a privelige to serve you.

## 2022-11-27 LAB — CBC
Hematocrit: 45.3 % (ref 37.5–51.0)
Hemoglobin: 14.5 g/dL (ref 13.0–17.7)
MCH: 26.5 pg — ABNORMAL LOW (ref 26.6–33.0)
MCHC: 32 g/dL (ref 31.5–35.7)
MCV: 83 fL (ref 79–97)
Platelets: 173 10*3/uL (ref 150–450)
RBC: 5.48 x10E6/uL (ref 4.14–5.80)
RDW: 14 % (ref 11.6–15.4)
WBC: 3.3 10*3/uL — ABNORMAL LOW (ref 3.4–10.8)

## 2022-11-27 LAB — TSH: TSH: 1.14 u[IU]/mL (ref 0.450–4.500)

## 2022-11-27 LAB — CMP14+EGFR
ALT: 21 IU/L (ref 0–44)
AST: 23 IU/L (ref 0–40)
Albumin/Globulin Ratio: 2.8 — ABNORMAL HIGH (ref 1.2–2.2)
Albumin: 4.8 g/dL (ref 4.1–5.1)
Alkaline Phosphatase: 63 IU/L (ref 44–121)
BUN/Creatinine Ratio: 14 (ref 9–20)
BUN: 14 mg/dL (ref 6–24)
Bilirubin Total: 0.9 mg/dL (ref 0.0–1.2)
CO2: 26 mmol/L (ref 20–29)
Calcium: 9.6 mg/dL (ref 8.7–10.2)
Chloride: 103 mmol/L (ref 96–106)
Creatinine, Ser: 1.01 mg/dL (ref 0.76–1.27)
Globulin, Total: 1.7 g/dL (ref 1.5–4.5)
Glucose: 77 mg/dL (ref 70–99)
Potassium: 4.2 mmol/L (ref 3.5–5.2)
Sodium: 142 mmol/L (ref 134–144)
Total Protein: 6.5 g/dL (ref 6.0–8.5)
eGFR: 96 mL/min/{1.73_m2} (ref >59)

## 2022-11-27 LAB — LIPID PANEL
Chol/HDL Ratio: 2.2 ratio (ref 0.0–5.0)
Cholesterol, Total: 85 mg/dL — ABNORMAL LOW (ref 100–199)
HDL: 38 mg/dL — ABNORMAL LOW (ref >39)
LDL Chol Calc (NIH): 35 mg/dL (ref 0–99)
Triglycerides: 47 mg/dL (ref 0–149)
VLDL Cholesterol Cal: 12 mg/dL (ref 5–40)

## 2022-11-27 LAB — HEMOGLOBIN A1C
Est. average glucose Bld gHb Est-mCnc: 120 mg/dL
Hgb A1c MFr Bld: 5.8 % — ABNORMAL HIGH (ref 4.8–5.6)

## 2022-11-27 LAB — VITAMIN D 25 HYDROXY (VIT D DEFICIENCY, FRACTURES): Vit D, 25-Hydroxy: 46.7 ng/mL (ref 30.0–100.0)

## 2022-11-28 ENCOUNTER — Encounter: Payer: Self-pay | Admitting: Family Medicine

## 2022-11-28 MED ORDER — ROSUVASTATIN CALCIUM 10 MG PO TABS: 10.0000 mg | ORAL_TABLET | Freq: Every day | ORAL | 0 refills | Status: DC

## 2022-11-28 MED ORDER — AMLODIPINE BESYLATE 2.5 MG PO TABS: 2.5000 mg | ORAL_TABLET | Freq: Every day | ORAL | 2 refills | Status: DC

## 2022-11-28 MED ORDER — VITAMIN D (ERGOCALCIFEROL) 1.25 MG (50000 UNIT) PO CAPS: 50000.0000 [IU] | ORAL_CAPSULE | ORAL | 0 refills | Status: DC

## 2022-11-28 NOTE — Assessment & Plan Note (Signed)
Controlled, no change in medication Hyperlipidemia:Low fat diet discussed and encouraged.   Lipid Panel  Lab Results  Component Value Date   CHOL 85 (L) 11/26/2022   HDL 38 (L) 11/26/2022   LDLCALC 35 11/26/2022   TRIG 47 11/26/2022   CHOLHDL 2.2 11/26/2022     Needs to increase exercise

## 2022-11-28 NOTE — Assessment & Plan Note (Signed)
Controlled, no change in medication  

## 2022-11-28 NOTE — Assessment & Plan Note (Signed)
Controlled, no change in medication DASH diet and commitment to daily physical activity for a minimum of 30 minutes discussed and encouraged, as a part of hypertension management. The importance of attaining a healthy weight is also discussed.     11/26/2022   11:24 AM 07/18/2022   10:33 AM 05/16/2022   11:25 PM 05/16/2022    8:31 AM 11/13/2021    4:15 PM 11/13/2021    3:47 PM 08/22/2021    4:27 PM  BP/Weight  Systolic BP 116 119 140 136 120 131   Diastolic BP 74 74 88 88 70 75   Wt. (Lbs) 207.12 219.12  223  216.12 227  BMI 32.44 kg/m2 34.32 kg/m2  34.93 kg/m2  33.85 kg/m2 35.55 kg/m2

## 2022-11-28 NOTE — Progress Notes (Signed)
Gilbert Cohen     MRN: [id number]      DOB: 12-07-1980  Chief Complaint  Patient presents with   Follow-up    Follow up mother would like your recommendation for a new primary care provider for patient in [place] since that is where they live.    HPI Gilbert Cohen is here for follow up and re-evaluation of chronic medical conditions, medication management and review of any available recent lab and radiology data.  Preventive health is updated, specifically  Cancer screening and Immunization.   Questions or concerns regarding consultations or procedures which the PT has had in the interim are  addressed. The PT denies any adverse reactions to current medications since the last visit.  There are no new concerns.  There are no specific complaints  Mother reports Gilbert Cohen is doing well, she is requesting new PCP for him as she is increasingly physically challenged and unable to bring him to visits in [place]. I recommend she get him established at the same clinic / Provider she is currently established with Gilbert Cohen states he is doing well and has no complaints/concerns  ROS Denies recent fever or chills. Denies sinus pressure, nasal congestion, ear pain or sore throat. Denies chest congestion, productive cough or wheezing. Denies chest pains, palpitations and leg swelling Denies abdominal pain, nausea, vomiting,diarrhea or constipation.   Denies dysuria, frequency, hesitancy or incontinence. Denies joint pain, swelling and limitation in mobility. Denies headaches, seizures, numbness, or tingling. Denies uncontrolled depression, anxiety or insomnia. Denies skin break down or rash.   PE  BP 116/74 (BP Location: Right Arm, Patient Position: Sitting, Cuff Size: Large)   Pulse 68   Ht 5\' 7"  (1.702 m)   Wt 207 lb 1.9 oz (93.9 kg)   SpO2 97%   BMI 32.44 kg/m   Patient alert  and in no cardiopulmonary distress.  HEENT: No facial asymmetry, EOMI,     Neck supple .  Chest: Clear to  auscultation bilaterally.  CVS: S1, S2 no murmurs, no S3.Regular rate.  ABD: Soft non tender.   Ext: No edema  MS: Adequate ROM spine, shoulders, hips and knees.  Skin: Intact, no ulcerations or rash noted.  Psych:  not anxious or depressed appearing.  CNS: CN 2-12 intact, power,  normal throughout.no focal deficits noted.   Assessment & Plan  Essential hypertension Controlled, no change in medication DASH diet and commitment to daily physical activity for a minimum of 30 minutes discussed and encouraged, as a part of hypertension management. The importance of attaining a healthy weight is also discussed.     11/26/2022   11:24 AM 07/18/2022   10:33 AM 05/16/2022   11:25 PM 05/16/2022    8:31 AM 11/13/2021    4:15 PM 11/13/2021    3:47 PM 08/22/2021    4:27 PM  BP/Weight  Systolic BP 116 119 140 136 120 131   Diastolic BP 74 74 88 88 70 75   Wt. (Lbs) 207.12 219.12  223  216.12 227  BMI 32.44 kg/m2 34.32 kg/m2  34.93 kg/m2  33.85 kg/m2 35.55 kg/m2       Obesity (BMI 30.0-34.9) Improved which is  great  Patient re-educated about  the importance of commitment to a  minimum of 150 minutes of exercise per week as able.  The importance of healthy food choices with portion control discussed, as well as eating regularly and within a 12 hour window most days. The need to choose "clean , green" food  50 to 75% of the time is discussed, as well as to make water the primary drink and set a goal of 64 ounces water daily.       11/26/2022   11:24 AM 07/18/2022   10:33 AM 05/16/2022    8:31 AM  Weight /BMI  Weight 207 lb 1.9 oz 219 lb 1.9 oz 223 lb  Height 5\' 7"  (1.702 m) 5\' 7"  (1.702 m) 5\' 7"  (1.702 m)  BMI 32.44 kg/m2 34.32 kg/m2 34.93 kg/m2      Dyslipidemia Controlled, no change in medication Hyperlipidemia:Low fat diet discussed and encouraged.   Lipid Panel  Lab Results  Component Value Date   CHOL 85 (L) 11/26/2022   HDL 38 (L) 11/26/2022   LDLCALC 35  11/26/2022   TRIG 47 11/26/2022   CHOLHDL 2.2 11/26/2022     Needs to increase exercise  Prediabetes Patient educated about the importance of limiting  Carbohydrate intake , the need to commit to daily physical activity for a minimum of 30 minutes , and to commit weight loss. The fact that changes in all these areas will reduce or eliminate all together the development of diabetes is stressed.  Unchanged     Latest Ref Rng & Units 11/26/2022   11:55 AM 05/16/2022    9:34 AM 11/13/2021    4:26 PM 10/18/2020    4:38 PM 11/22/2019   10:09 AM  Diabetic Labs  HbA1c 4.8 - 5.6 % 5.8  5.8  5.7  6.2  5.7   Chol 100 - 199 mg/dL 85  045  409  811  914   HDL >39 mg/dL 38  35  37  31  33   Calc LDL 0 - 99 mg/dL 35  81  51  782  956   Triglycerides 0 - 149 mg/dL 47  213  79  086  578   Creatinine 0.76 - 1.27 mg/dL 4.69  6.29  5.28  4.13  1.18       11/26/2022   11:24 AM 07/18/2022   10:33 AM 05/16/2022   11:25 PM 05/16/2022    8:31 AM 11/13/2021    4:15 PM 11/13/2021    3:47 PM 08/22/2021    4:27 PM  BP/Weight  Systolic BP 116 119 140 136 120 131   Diastolic BP 74 74 88 88 70 75   Wt. (Lbs) 207.12 219.12  223  216.12 227  BMI 32.44 kg/m2 34.32 kg/m2  34.93 kg/m2  33.85 kg/m2 35.55 kg/m2       No data to display            Vitamin D deficiency Corrected , no change in management  Depression Controlled, no change in medication   Developmental disability Currently living with his Mother and doing much better, hope is that he will be able to live independently, and seems to be making progress in this direction

## 2022-11-28 NOTE — Assessment & Plan Note (Signed)
Currently living with his Mother and doing much better, hope is that he will be able to live independently, and seems to be making progress in this direction

## 2022-11-28 NOTE — Assessment & Plan Note (Signed)
Corrected , no change in management

## 2022-11-28 NOTE — Assessment & Plan Note (Signed)
Improved which is  great  Patient re-educated about  the importance of commitment to a  minimum of 150 minutes of exercise per week as able.  The importance of healthy food choices with portion control discussed, as well as eating regularly and within a 12 hour window most days. The need to choose "clean , green" food 50 to 75% of the time is discussed, as well as to make water the primary drink and set a goal of 64 ounces water daily.       11/26/2022   11:24 AM 07/18/2022   10:33 AM 05/16/2022    8:31 AM  Weight /BMI  Weight 207 lb 1.9 oz 219 lb 1.9 oz 223 lb  Height 5\' 7"  (1.702 m) 5\' 7"  (1.702 m) 5\' 7"  (1.702 m)  BMI 32.44 kg/m2 34.32 kg/m2 34.93 kg/m2

## 2022-11-28 NOTE — Assessment & Plan Note (Signed)
Patient educated about the importance of limiting  Carbohydrate intake , the need to commit to daily physical activity for a minimum of 30 minutes , and to commit weight loss. The fact that changes in all these areas will reduce or eliminate all together the development of diabetes is stressed.  Unchanged     Latest Ref Rng & Units 11/26/2022   11:55 AM 05/16/2022    9:34 AM 11/13/2021    4:26 PM 10/18/2020    4:38 PM 11/22/2019   10:09 AM  Diabetic Labs  HbA1c 4.8 - 5.6 % 5.8  5.8  5.7  6.2  5.7   Chol 100 - 199 mg/dL 85  536  644  034  742   HDL >39 mg/dL 38  35  37  31  33   Calc LDL 0 - 99 mg/dL 35  81  51  595  638   Triglycerides 0 - 149 mg/dL 47  756  79  433  295   Creatinine 0.76 - 1.27 mg/dL 1.88  4.16  6.06  3.01  1.18       11/26/2022   11:24 AM 07/18/2022   10:33 AM 05/16/2022   11:25 PM 05/16/2022    8:31 AM 11/13/2021    4:15 PM 11/13/2021    3:47 PM 08/22/2021    4:27 PM  BP/Weight  Systolic BP 116 119 140 136 120 131   Diastolic BP 74 74 88 88 70 75   Wt. (Lbs) 207.12 219.12  223  216.12 227  BMI 32.44 kg/m2 34.32 kg/m2  34.93 kg/m2  33.85 kg/m2 35.55 kg/m2       No data to display

## 2022-12-02 ENCOUNTER — Ambulatory Visit (INDEPENDENT_AMBULATORY_CARE_PROVIDER_SITE_OTHER): Payer: Medicare Other

## 2022-12-02 DIAGNOSIS — Z0000 Encounter for general adult medical examination without abnormal findings: Secondary | ICD-10-CM | POA: Diagnosis not present

## 2022-12-02 NOTE — Progress Notes (Signed)
Subjective:   HERSCHELL FERREBEE is a 42 y.o. male who presents for Medicare Annual/Subsequent preventive examination.  Review of Systems    I connected with  CHRISHON FRERKING on 12/02/22 by a audio enabled telemedicine application and verified that I am speaking with the correct person using two identifiers.  Patient Location: Home  Provider Location: Office/Clinic  I discussed the limitations of evaluation and management by telemedicine. The patient expressed understanding and agreed to proceed.        Objective:    There were no vitals filed for this visit. There is no height or weight on file to calculate BMI.     06/06/2020    9:34 AM 05/25/2018    2:58 PM 04/08/2017   10:52 PM 04/08/2017    7:09 PM 03/05/2017    6:39 PM  Advanced Directives  Does Patient Have a Medical Advance Directive? No No No No No  Would patient like information on creating a medical advance directive? No - Patient declined No - Patient declined No - Patient declined No - Patient declined     Current Medications (verified) Outpatient Encounter Medications as of 12/02/2022  Medication Sig   amLODipine (NORVASC) 2.5 MG tablet Take 1 tablet (2.5 mg total) by mouth daily.   citalopram (CELEXA) 40 MG tablet TAKE 1 TABLET BY MOUTH EVERY DAY IN THE MORNING   montelukast (SINGULAIR) 10 MG tablet TAKE 1 TABLET BY MOUTH EVERYDAY AT BEDTIME   Omega-3 Fatty Acids (FISH OIL) 1000 MG CAPS Two daily   rosuvastatin (CRESTOR) 10 MG tablet Take 1 tablet (10 mg total) by mouth daily.   Vitamin D, Ergocalciferol, (DRISDOL) 1.25 MG (50000 UNIT) CAPS capsule Take 1 capsule (50,000 Units total) by mouth every 7 (seven) days.   No facility-administered encounter medications on file as of 12/02/2022.    Allergies (verified) Patient has no known allergies.   History: Past Medical History:  Diagnosis Date   Autistic spectrum disorder    Hyperlipidemia    Intellectual disability    Mental retardation 1982    hypoxic brain injury at birth   No past surgical history on file. Family History  Problem Relation Age of Onset   Hypertension Mother    Cancer Sister 43       localized breast cancer in 2014   Social History   Socioeconomic History   Marital status: Single    Spouse name: N/A   Number of children: 0   Years of education: 12   Highest education level: Not on file  Occupational History   Occupation: disabled   Tobacco Use   Smoking status: Never   Smokeless tobacco: Never  Vaping Use   Vaping Use: Never used  Substance and Sexual Activity   Alcohol use: No   Drug use: No   Sexual activity: Not Currently  Other Topics Concern   Not on file  Social History Narrative   Lives in a group home    Social Determinants of Health   Financial Resource Strain: Low Risk  (08/22/2021)   Overall Financial Resource Strain (CARDIA)    Difficulty of Paying Living Expenses: Not hard at all  Food Insecurity: No Food Insecurity (08/22/2021)   Hunger Vital Sign    Worried About Running Out of Food in the Last Year: Never true    Ran Out of Food in the Last Year: Never true  Transportation Needs: No Transportation Needs (08/22/2021)   PRAPARE - Transportation    Lack of  Transportation (Medical): No    Lack of Transportation (Non-Medical): No  Physical Activity: Inactive (08/22/2021)   Exercise Vital Sign    Days of Exercise per Week: 0 days    Minutes of Exercise per Session: 0 min  Stress: No Stress Concern Present (08/22/2021)   [employer] of Occupational Health - Occupational Stress Questionnaire    Feeling of Stress : Not at all  Social Connections: Socially Isolated (08/22/2021)   Social Connection and Isolation Panel [NHANES]    Frequency of Communication with Friends and Family: Twice a week    Frequency of Social Gatherings with Friends and Family: More than three times a week    Attends Religious Services: Never    Database administrator or Organizations: No    Attends  Engineer, structural: Never    Marital Status: Never married    Tobacco Counseling Counseling given: Not Answered   Clinical Intake:                 Diabetic?no         Activities of Daily Living     No data to display           Patient Care Team: Kerri Perches, MD as PCP - General (Family Medicine)  Indicate any recent Medical Services you may have received from other than [facility] providers in the past year (date may be approximate).     Assessment:   This is a routine wellness examination for Salahuddin.  Hearing/Vision screen No results found.  Dietary issues and exercise activities discussed:     Goals Addressed   None   Depression Screen    11/26/2022   11:25 AM 07/18/2022   10:34 AM 05/16/2022    8:33 AM 11/13/2021    3:47 PM 08/22/2021    4:32 PM 08/22/2021    4:30 PM 10/18/2020    3:54 PM  PHQ 2/9 Scores  PHQ - 2 Score 0 1 0 0 0 0 0    Fall Risk    11/26/2022   11:25 AM 07/18/2022   10:33 AM 05/16/2022    8:33 AM 11/13/2021    3:47 PM 08/22/2021    4:32 PM  Fall Risk   Falls in the past year? 0 0 0 0 0  Number falls in past yr: 0 0 0 0   Injury with Fall? 0 0 0 0   Risk for fall due to : No Fall Risks No Fall Risks No Fall Risks No Fall Risks   Follow up Falls evaluation completed Falls evaluation completed Falls evaluation completed Falls evaluation completed     FALL RISK PREVENTION PERTAINING TO THE HOME:  Any stairs in or around the home? No  If so, are there any without handrails? No  Home free of loose throw rugs in walkways, pet beds, electrical cords, etc? Yes  Adequate lighting in your home to reduce risk of falls? Yes   ASSISTIVE DEVICES UTILIZED TO PREVENT FALLS:  Life alert? No  Use of a cane, walker or w/c? No  Grab bars in the bathroom? Yes  Shower chair or bench in shower? No  Elevated toilet seat or a handicapped toilet? No           08/22/2021    4:33 PM 06/06/2020    9:37 AM 05/27/2019    10:38 AM 05/25/2018    2:59 PM  6CIT Screen  What Year? 0 points 0 points 0 points 0 points  What  month? 0 points 0 points 0 points 0 points  What time? 0 points 0 points 0 points 0 points  Count back from 20 0 points 0 points 0 points 0 points  Months in reverse 0 points 0 points 0 points 0 points  Repeat phrase 0 points 0 points 2 points 0 points  Total Score 0 points 0 points 2 points 0 points    Immunizations Immunization History  Administered Date(s) Administered   COVID-19, mRNA, vaccine(Comirnaty)12 years and older 04/15/2022   Influenza,inj,Quad PF,6+ Mos 05/18/2013, 04/14/2014, 04/24/2015, 05/07/2016, 05/14/2017, 03/15/2018, 04/23/2020, 04/24/2021, 04/15/2022   PPD Test 07/13/2017, 10/13/2017   Tdap 12/06/2012    TDAP status: Up to date  Flu Vaccine status: Up to date   Covid-19 vaccine status: Completed vaccines     Screening Tests Health Maintenance  Topic Date Due   DTaP/Tdap/Td (2 - Td or Tdap) 12/07/2022   INFLUENZA VACCINE  02/05/2023   Medicare Annual Wellness (AWV)  12/02/2023   Hepatitis C Screening  Completed   HIV Screening  Completed   HPV VACCINES  Aged Out   COVID-19 Vaccine  Discontinued    Health Maintenance  There are no preventive care reminders to display for this patient.   Lung Cancer Screening: (Low Dose CT Chest recommended if Age 38-80 years, 30 pack-year currently smoking OR have quit w/in 15years.) does not qualify.   Lung Cancer Screening Referral: no  Additional Screening:  Hepatitis C Screening: does qualify; Completed 10/18/20  Vision Screening: Recommended annual ophthalmology exams for early detection of glaucoma and other disorders of the eye. Is the patient up to date with their annual eye exam?  Yes  Who is the provider or what is the name of the office in which the patient attends annual eye exams? N/a If pt is not established with a provider, would they like to be referred to a provider to establish care? No .    Dental Screening: Recommended annual dental exams for proper oral hygiene  Community Resource Referral / Chronic Care Management: CRR required this visit?  No   CCM required this visit?  No      Plan:     I have personally reviewed and noted the following in the patient's chart:   Medical and social history Use of alcohol, tobacco or illicit drugs  Current medications and supplements including opioid prescriptions. Patient is not currently taking opioid prescriptions. Functional ability and status Nutritional status Physical activity Advanced directives List of other physicians Hospitalizations, surgeries, and ER visits in previous 12 months Vitals Screenings to include cognitive, depression, and falls Referrals and appointments  In addition, I have reviewed and discussed with patient certain preventive protocols, quality metrics, and best practice recommendations. A written personalized care plan for preventive services as well as general preventive health recommendations were provided to patient.     Jasper Riling, [place]   12/02/2022

## 2022-12-02 NOTE — Patient Instructions (Signed)
  Gilbert Cohen , Thank you for taking time to come for your Medicare Wellness Visit. I appreciate your ongoing commitment to your health goals. Please review the following plan we discussed and let me know if I can assist you in the future.   These are the goals we discussed:  Goals      Increase physical activity     Patient Stated     None     Social and Functional Skills Optimized     Wants to form a relationship (get a girlfriend).         This is a list of the screening recommended for you and due dates:  Health Maintenance  Topic Date Due   DTaP/Tdap/Td vaccine (2 - Td or Tdap) 12/07/2022   Flu Shot  02/05/2023   Medicare Annual Wellness Visit  12/02/2023   Hepatitis C Screening  Completed   HIV Screening  Completed   HPV Vaccine  Aged Out   COVID-19 Vaccine  Discontinued

## 2022-12-04 ENCOUNTER — Other Ambulatory Visit: Payer: Self-pay | Admitting: Family Medicine

## 2022-12-11 ENCOUNTER — Other Ambulatory Visit: Payer: Self-pay | Admitting: Family Medicine

## 2023-02-08 ENCOUNTER — Other Ambulatory Visit: Payer: Self-pay | Admitting: Family Medicine

## 2023-03-26 ENCOUNTER — Encounter: Payer: Self-pay | Admitting: Nurse Practitioner

## 2023-03-26 ENCOUNTER — Ambulatory Visit: Payer: Medicare Other | Admitting: Nurse Practitioner

## 2023-03-26 VITALS — BP 108/70 | HR 60 | Temp 97.8°F | Ht 67.0 in | Wt 209.6 lb

## 2023-03-26 DIAGNOSIS — F3341 Major depressive disorder, recurrent, in partial remission: Secondary | ICD-10-CM

## 2023-03-26 DIAGNOSIS — Z23 Encounter for immunization: Secondary | ICD-10-CM | POA: Diagnosis not present

## 2023-03-26 DIAGNOSIS — E785 Hyperlipidemia, unspecified: Secondary | ICD-10-CM | POA: Diagnosis not present

## 2023-03-26 DIAGNOSIS — R7303 Prediabetes: Secondary | ICD-10-CM

## 2023-03-26 DIAGNOSIS — Z9189 Other specified personal risk factors, not elsewhere classified: Secondary | ICD-10-CM

## 2023-03-26 DIAGNOSIS — I10 Essential (primary) hypertension: Secondary | ICD-10-CM | POA: Diagnosis not present

## 2023-03-26 DIAGNOSIS — G931 Anoxic brain damage, not elsewhere classified: Secondary | ICD-10-CM | POA: Diagnosis not present

## 2023-03-26 DIAGNOSIS — F840 Autistic disorder: Secondary | ICD-10-CM

## 2023-03-26 DIAGNOSIS — R0989 Other specified symptoms and signs involving the circulatory and respiratory systems: Secondary | ICD-10-CM

## 2023-03-26 DIAGNOSIS — E559 Vitamin D deficiency, unspecified: Secondary | ICD-10-CM

## 2023-03-26 DIAGNOSIS — I6782 Cerebral ischemia: Secondary | ICD-10-CM | POA: Diagnosis not present

## 2023-03-26 DIAGNOSIS — Z7689 Persons encountering health services in other specified circumstances: Secondary | ICD-10-CM

## 2023-03-26 MED ORDER — LORATADINE 10 MG PO TABS: 10.0000 mg | ORAL_TABLET | Freq: Every day | ORAL | 2 refills | Status: DC

## 2023-03-27 LAB — CMP14+EGFR
ALT: 23 IU/L (ref 0–44)
AST: 24 IU/L (ref 0–40)
Albumin: 4.9 g/dL (ref 4.1–5.1)
Alkaline Phosphatase: 59 IU/L (ref 44–121)
BUN/Creatinine Ratio: 13 (ref 9–20)
BUN: 15 mg/dL (ref 6–24)
Bilirubin Total: 0.8 mg/dL (ref 0.0–1.2)
CO2: 24 mmol/L (ref 20–29)
Calcium: 10.1 mg/dL (ref 8.7–10.2)
Chloride: 101 mmol/L (ref 96–106)
Creatinine, Ser: 1.2 mg/dL (ref 0.76–1.27)
Globulin, Total: 2.1 g/dL (ref 1.5–4.5)
Glucose: 91 mg/dL (ref 70–99)
Potassium: 4.2 mmol/L (ref 3.5–5.2)
Sodium: 140 mmol/L (ref 134–144)
Total Protein: 7 g/dL (ref 6.0–8.5)
eGFR: 77 mL/min/{1.73_m2} (ref >59)

## 2023-03-27 LAB — LIPID PANEL
Chol/HDL Ratio: 3.8 ratio (ref 0.0–5.0)
Cholesterol, Total: 118 mg/dL (ref 100–199)
HDL: 31 mg/dL — ABNORMAL LOW (ref >39)
LDL Chol Calc (NIH): 56 mg/dL (ref 0–99)
Triglycerides: 187 mg/dL — ABNORMAL HIGH (ref 0–149)
VLDL Cholesterol Cal: 31 mg/dL (ref 5–40)

## 2023-03-27 LAB — HEMOGLOBIN A1C
Est. average glucose Bld gHb Est-mCnc: 120 mg/dL
Hgb A1c MFr Bld: 5.8 % — ABNORMAL HIGH (ref 4.8–5.6)

## 2023-03-27 LAB — VITAMIN D 25 HYDROXY (VIT D DEFICIENCY, FRACTURES): Vit D, 25-Hydroxy: 51.2 ng/mL (ref 30.0–100.0)

## 2023-04-08 DIAGNOSIS — Z0120 Encounter for dental examination and cleaning without abnormal findings: Secondary | ICD-10-CM | POA: Insufficient documentation

## 2023-04-08 DIAGNOSIS — Z9189 Other specified personal risk factors, not elsewhere classified: Secondary | ICD-10-CM | POA: Insufficient documentation

## 2023-04-08 DIAGNOSIS — Z23 Encounter for immunization: Secondary | ICD-10-CM | POA: Insufficient documentation

## 2023-04-08 DIAGNOSIS — Z7689 Persons encountering health services in other specified circumstances: Secondary | ICD-10-CM | POA: Insufficient documentation

## 2023-04-08 NOTE — Assessment & Plan Note (Signed)
Will check Hgba1c.

## 2023-04-08 NOTE — Assessment & Plan Note (Signed)
Patient is here to establish care. Went over patient medical, family, social and surgical history, discussed with his mother and patient Reviewed with patient their medications and any allergies  Reviewed with patient their sexual orientation, drug/tobacco and alcohol use Dicussed any new concerns with patient  recommended patient comes in for a physical exam and complete blood work.  Educated patient about the importance of annual screenings and immunizations.  Advised patient to eat a healthy diet along with exercise for atleast 30-45 min atleast 4-5 days of the week.

## 2023-04-08 NOTE — Assessment & Plan Note (Signed)
He has not been established with a dentist, requesting referral for dental care.

## 2023-04-08 NOTE — Assessment & Plan Note (Signed)
Stable and working.

## 2023-04-08 NOTE — Assessment & Plan Note (Signed)
Blood pressure is well controlled. Continue current medications. 

## 2023-04-08 NOTE — Assessment & Plan Note (Signed)
Will check vitamin D level and supplement as needed.    Also encouraged to spend 15 minutes in the sun daily.   

## 2023-04-08 NOTE — Assessment & Plan Note (Signed)
Intermittent runny nose may be related to seasonal allergies.

## 2023-04-08 NOTE — Assessment & Plan Note (Signed)
As a child, seen from previous note from previous provider.

## 2023-04-08 NOTE — Assessment & Plan Note (Signed)
Influenza vaccine administered Encouraged to take Tylenol as needed for fever or muscle aches.

## 2023-04-08 NOTE — Assessment & Plan Note (Signed)
Will give tetanus vaccine today while in office. Refer to order management. TDAP will be administered to adults 79-42 years old every 10 years.

## 2023-04-08 NOTE — Assessment & Plan Note (Signed)
Will check lipid panel. 

## 2023-04-08 NOTE — Assessment & Plan Note (Signed)
Depression screen score is 11 however the patient verbalizes and the mother reports he is saddened because he does not have a significant other.

## 2023-05-21 ENCOUNTER — Other Ambulatory Visit: Payer: Self-pay | Admitting: Family Medicine

## 2023-05-29 ENCOUNTER — Other Ambulatory Visit: Payer: Self-pay | Admitting: Family Medicine

## 2023-06-21 ENCOUNTER — Other Ambulatory Visit: Payer: Self-pay | Admitting: Family Medicine

## 2023-06-29 ENCOUNTER — Ambulatory Visit: Payer: Medicare Other | Admitting: Nurse Practitioner

## 2023-07-06 ENCOUNTER — Ambulatory Visit: Payer: Medicare Other | Admitting: Nurse Practitioner

## 2023-07-06 ENCOUNTER — Encounter: Payer: Self-pay | Admitting: Nurse Practitioner

## 2023-07-06 VITALS — BP 130/92 | HR 72 | Temp 98.4°F | Ht 68.2 in | Wt 216.6 lb

## 2023-07-06 DIAGNOSIS — I10 Essential (primary) hypertension: Secondary | ICD-10-CM | POA: Diagnosis not present

## 2023-07-06 DIAGNOSIS — E785 Hyperlipidemia, unspecified: Secondary | ICD-10-CM | POA: Diagnosis not present

## 2023-07-06 DIAGNOSIS — E559 Vitamin D deficiency, unspecified: Secondary | ICD-10-CM

## 2023-07-06 DIAGNOSIS — E66811 Obesity, class 1: Secondary | ICD-10-CM

## 2023-07-06 DIAGNOSIS — Z79899 Other long term (current) drug therapy: Secondary | ICD-10-CM

## 2023-07-06 DIAGNOSIS — F840 Autistic disorder: Secondary | ICD-10-CM

## 2023-07-06 DIAGNOSIS — R7303 Prediabetes: Secondary | ICD-10-CM | POA: Diagnosis not present

## 2023-07-06 DIAGNOSIS — F3341 Major depressive disorder, recurrent, in partial remission: Secondary | ICD-10-CM

## 2023-07-06 NOTE — Assessment & Plan Note (Signed)
Will check vitamin D level and supplement as needed.    Also encouraged to spend 15 minutes in the sun daily.   

## 2023-07-06 NOTE — Assessment & Plan Note (Signed)
HgbA1c is stable. Will check HgbA1c today

## 2023-07-06 NOTE — Assessment & Plan Note (Addendum)
Depression screen score is 4, down from 11.  At this time we will continue his citalopram at the current dose.

## 2023-07-06 NOTE — Patient Instructions (Signed)
Hypertension, Adult Hypertension is another name for high blood pressure. High blood pressure forces your heart to work harder to pump blood. This can cause problems over time. There are two numbers in a blood pressure reading. There is a top number (systolic) over a bottom number (diastolic). It is best to have a blood pressure that is below 120/80. What are the causes? The cause of this condition is not known. Some other conditions can lead to high blood pressure. What increases the risk? Some lifestyle factors can make you more likely to develop high blood pressure: Smoking. Not getting enough exercise or physical activity. Being overweight. Having too much fat, sugar, calories, or salt (sodium) in your diet. Drinking too much alcohol. Other risk factors include: Having any of these conditions: Heart disease. Diabetes. High cholesterol. Kidney disease. Obstructive sleep apnea. Having a family history of high blood pressure and high cholesterol. Age. The risk increases with age. Stress. What are the signs or symptoms? High blood pressure may not cause symptoms. Very high blood pressure (hypertensive crisis) may cause: Headache. Fast or uneven heartbeats (palpitations). Shortness of breath. Nosebleed. Vomiting or feeling like you may vomit (nauseous). Changes in how you see. Very bad chest pain. Feeling dizzy. Seizures. How is this treated? This condition is treated by making healthy lifestyle changes, such as: Eating healthy foods. Exercising more. Drinking less alcohol. Your doctor may prescribe medicine if lifestyle changes do not help enough and if: Your top number is above 130. Your bottom number is above 80. Your personal target blood pressure may vary. Follow these instructions at home: Eating and drinking  If told, follow the DASH eating plan. To follow this plan: Fill one half of your plate at each meal with fruits and vegetables. Fill one fourth of your plate  at each meal with whole grains. Whole grains include whole-wheat pasta, brown rice, and whole-grain bread. Eat or drink low-fat dairy products, such as skim milk or low-fat yogurt. Fill one fourth of your plate at each meal with low-fat (lean) proteins. Low-fat proteins include fish, chicken without skin, eggs, beans, and tofu. Avoid fatty meat, cured and processed meat, or chicken with skin. Avoid pre-made or processed food. Limit the amount of salt in your diet to less than 1,500 mg each day. Do not drink alcohol if: Your doctor tells you not to drink. You are pregnant, may be pregnant, or are planning to become pregnant. If you drink alcohol: Limit how much you have to: 0-1 drink a day for women. 0-2 drinks a day for men. Know how much alcohol is in your drink. In the [place] one drink equals one 12 oz bottle of beer (355 mL), one 5 oz glass of wine (148 mL), or one 1 oz glass of hard liquor (44 mL). Lifestyle  Work with your doctor to stay at a healthy weight or to lose weight. Ask your doctor what the best weight is for you. Get at least 30 minutes of exercise that causes your heart to beat faster (aerobic exercise) most days of the week. This may include walking, swimming, or biking. Get at least 30 minutes of exercise that strengthens your muscles (resistance exercise) at least 3 days a week. This may include lifting weights or doing Pilates. Do not smoke or use any products that contain nicotine or tobacco. If you need help quitting, ask your doctor. Check your blood pressure at home as told by your doctor. Keep all follow-up visits. Medicines Take over-the-counter and prescription medicines   only as told by your doctor. Follow directions carefully. Do not skip doses of blood pressure medicine. The medicine does not work as well if you skip doses. Skipping doses also puts you at risk for problems. Ask your doctor about side effects or reactions to medicines that you should watch  for. Contact a doctor if: You think you are having a reaction to the medicine you are taking. You have headaches that keep coming back. You feel dizzy. You have swelling in your ankles. You have trouble with your vision. Get help right away if: You get a very bad headache. You start to feel mixed up (confused). You feel weak or numb. You feel faint. You have very bad pain in your: Chest. Belly (abdomen). You vomit more than once. You have trouble breathing. These symptoms may be an emergency. Get help right away. Call 911. Do not wait to see if the symptoms will go away. Do not drive yourself to the hospital. Summary Hypertension is another name for high blood pressure. High blood pressure forces your heart to work harder to pump blood. For most people, a normal blood pressure is less than 120/80. Making healthy choices can help lower blood pressure. If your blood pressure does not get lower with healthy choices, you may need to take medicine. This information is not intended to replace advice given to you by your health care provider. Make sure you discuss any questions you have with your health care provider. Document Revised: 04/11/2021 Document Reviewed: 04/11/2021 Elsevier Patient Education  2024 [employer] Inc.  

## 2023-07-06 NOTE — Assessment & Plan Note (Signed)
Blood pressure is elevated today, may be related to the recent holiday eating. Continue current medications.

## 2023-07-06 NOTE — Assessment & Plan Note (Addendum)
Stable and working a part time job at [employer]

## 2023-07-06 NOTE — Assessment & Plan Note (Signed)
Cholesterol levels are stable

## 2023-07-07 LAB — CBC
Hematocrit: 47.3 % (ref 37.5–51.0)
Hemoglobin: 15.2 g/dL (ref 13.0–17.7)
MCH: 27 pg (ref 26.6–33.0)
MCHC: 32.1 g/dL (ref 31.5–35.7)
MCV: 84 fL (ref 79–97)
Platelets: 183 10*3/uL (ref 150–450)
RBC: 5.64 x10E6/uL (ref 4.14–5.80)
RDW: 14.4 % (ref 11.6–15.4)
WBC: 3.8 10*3/uL (ref 3.4–10.8)

## 2023-07-07 LAB — CMP14+EGFR
ALT: 30 [IU]/L (ref 0–44)
AST: 30 [IU]/L (ref 0–40)
Albumin: 5 g/dL (ref 4.1–5.1)
Alkaline Phosphatase: 59 [IU]/L (ref 44–121)
BUN/Creatinine Ratio: 12 (ref 9–20)
BUN: 16 mg/dL (ref 6–24)
Bilirubin Total: 1.2 mg/dL (ref 0.0–1.2)
CO2: 23 mmol/L (ref 20–29)
Calcium: 9.3 mg/dL (ref 8.7–10.2)
Chloride: 101 mmol/L (ref 96–106)
Creatinine, Ser: 1.39 mg/dL — ABNORMAL HIGH (ref 0.76–1.27)
Globulin, Total: 2 g/dL (ref 1.5–4.5)
Glucose: 93 mg/dL (ref 70–99)
Potassium: 4.2 mmol/L (ref 3.5–5.2)
Sodium: 138 mmol/L (ref 134–144)
Total Protein: 7 g/dL (ref 6.0–8.5)
eGFR: 65 mL/min/{1.73_m2} (ref >59)

## 2023-07-07 LAB — HEMOGLOBIN A1C
Est. average glucose Bld gHb Est-mCnc: 126 mg/dL
Hgb A1c MFr Bld: 6 % — ABNORMAL HIGH (ref 4.8–5.6)

## 2023-07-07 LAB — LIPID PANEL
Chol/HDL Ratio: 4 ratio (ref 0.0–5.0)
Cholesterol, Total: 141 mg/dL (ref 100–199)
HDL: 35 mg/dL — ABNORMAL LOW
LDL Chol Calc (NIH): 89 mg/dL (ref 0–99)
Triglycerides: 87 mg/dL (ref 0–149)
VLDL Cholesterol Cal: 17 mg/dL (ref 5–40)

## 2023-07-07 NOTE — Assessment & Plan Note (Addendum)
Discussed with him to try prep, he is not interested [facility]  He is also not interested in going to a nutritionist

## 2023-08-11 ENCOUNTER — Ambulatory Visit: Payer: Medicare Other

## 2023-08-25 ENCOUNTER — Ambulatory Visit: Payer: Medicare Other

## 2023-08-31 ENCOUNTER — Encounter: Payer: Self-pay | Admitting: Nurse Practitioner

## 2023-08-31 ENCOUNTER — Ambulatory Visit (INDEPENDENT_AMBULATORY_CARE_PROVIDER_SITE_OTHER): Payer: Medicare Other | Admitting: Nurse Practitioner

## 2023-08-31 VITALS — BP 110/70 | HR 73 | Temp 97.7°F | Ht 68.2 in | Wt 223.0 lb

## 2023-08-31 DIAGNOSIS — E66811 Obesity, class 1: Secondary | ICD-10-CM | POA: Diagnosis not present

## 2023-08-31 DIAGNOSIS — I10 Essential (primary) hypertension: Secondary | ICD-10-CM

## 2023-08-31 NOTE — Assessment & Plan Note (Signed)
 His blood pressure is improved. Continue current medications. CMA demonstrated how to use blood pressure cuff while his mother was in the room

## 2023-08-31 NOTE — Progress Notes (Signed)
 Madelaine Bhat, CMA,acting as a Neurosurgeon for Arnette Felts, FNP.,have documented all relevant documentation on the behalf of Arnette Felts, FNP,as directed by  Arnette Felts, FNP while in the presence of Arnette Felts, FNP.  Subjective:  Patient ID: Gilbert Cohen , male    DOB: 1980/11/18 , 43 y.o.   MRN: [id number]  Chief Complaint  Patient presents with   Hypertension   Hyperlipidemia   Prediabetes    HPI  Patient presents today for a bp and dm follow up, Patient reports compliance with medication. Patient denies any chest pain, SOB, or headaches. Patient has no concerns today.      Past Medical History:  Diagnosis Date   Autistic spectrum disorder    Depression    Hyperlipidemia    Hypertension    Intellectual disability    Mental retardation 1982   hypoxic brain injury at birth     Family History  Problem Relation Age of Onset   Hypertension Mother    Cancer Sister 75       localized breast cancer in 2014     Current Outpatient Medications:    amLODipine (NORVASC) 2.5 MG tablet, TAKE 1 TABLET BY MOUTH EVERY DAY, Disp: 30 tablet, Rfl: 4   citalopram (CELEXA) 40 MG tablet, TAKE 1 TABLET BY MOUTH EVERY DAY IN THE MORNING, Disp: 30 tablet, Rfl: 3   loratadine (CLARITIN) 10 MG tablet, Take 1 tablet (10 mg total) by mouth daily., Disp: 90 tablet, Rfl: 2   montelukast (SINGULAIR) 10 MG tablet, TAKE 1 TABLET BY MOUTH EVERYDAY AT BEDTIME, Disp: 30 tablet, Rfl: 3   Omega-3 Fatty Acids (FISH OIL) 1000 MG CAPS, Two daily, Disp: 60 capsule, Rfl: 3   rosuvastatin (CRESTOR) 10 MG tablet, TAKE 1 TABLET EVERY DAY, Disp: 90 tablet, Rfl: 2   Vitamin D, Ergocalciferol, (DRISDOL) 1.25 MG (50000 UNIT) CAPS capsule, TAKE 1 WEEKLY, Disp: 12 capsule, Rfl: 2   No Known Allergies   Review of Systems  Constitutional: Negative.   Eyes: Negative.   Respiratory: Negative.    Cardiovascular: Negative.   Endocrine: Negative for polydipsia, polyphagia and polyuria.  Musculoskeletal: Negative.    Skin: Negative.   Psychiatric/Behavioral: Negative.       Today's Vitals   08/31/23 1007  BP: 110/70  Pulse: 73  Temp: 97.7 F (36.5 C)  TempSrc: Oral  Weight: 223 lb (101.2 kg)  Height: 5' 8.2" (1.732 m)  PainSc: 0-No pain   Body mass index is 33.71 kg/m.  Wt Readings from Last 3 Encounters:  08/31/23 223 lb (101.2 kg)  07/06/23 216 lb 9.6 oz (98.2 kg)  03/26/23 209 lb 9.6 oz (95.1 kg)     Objective:  Physical Exam Vitals reviewed.  Constitutional:      General: He is not in acute distress.    Appearance: Normal appearance.  Cardiovascular:     Rate and Rhythm: Normal rate and regular rhythm.     Pulses: Normal pulses.     Heart sounds: Normal heart sounds. No murmur heard. Pulmonary:     Effort: Pulmonary effort is normal. No respiratory distress.     Breath sounds: Normal breath sounds.  Skin:    General: Skin is warm and dry.     Capillary Refill: Capillary refill takes less than 2 seconds.  Neurological:     General: No focal deficit present.     Mental Status: He is alert and oriented to person, place, and time.     Cranial Nerves: No  cranial nerve deficit.     Motor: No weakness.         Assessment And Plan:  Essential hypertension Assessment & Plan: His blood pressure is improved. Continue current medications. CMA demonstrated how to use blood pressure cuff while his mother was in the room   Obesity (BMI 30.0-34.9) Assessment & Plan: he is encouraged to strive for BMI less than 30 to decrease cardiac risk. Advised to aim for at least 150 minutes of exercise per week.     Return for cancel appt in April and add to same day as AWV.  Patient was given opportunity to ask questions. Patient verbalized understanding of the plan and was able to repeat key elements of the plan. All questions were answered to their satisfaction.    Jeanell Sparrow, FNP, have reviewed all documentation for this visit. The documentation on 08/31/23 for the exam,  diagnosis, procedures, and orders are all accurate and complete.   IF YOU HAVE BEEN REFERRED TO A SPECIALIST, IT MAY TAKE 1-2 WEEKS TO SCHEDULE/PROCESS THE REFERRAL. IF YOU HAVE NOT HEARD FROM US/SPECIALIST IN TWO WEEKS, PLEASE GIVE [place] A CALL AT [phone].

## 2023-08-31 NOTE — Assessment & Plan Note (Signed)
 he is encouraged to strive for BMI less than 30 to decrease cardiac risk. Advised to aim for at least 150 minutes of exercise per week.

## 2023-09-12 ENCOUNTER — Other Ambulatory Visit: Payer: Self-pay | Admitting: Family Medicine

## 2023-09-17 ENCOUNTER — Other Ambulatory Visit: Payer: Self-pay | Admitting: Family Medicine

## 2023-10-06 ENCOUNTER — Ambulatory Visit: Payer: Self-pay | Admitting: Nurse Practitioner

## 2023-11-10 ENCOUNTER — Other Ambulatory Visit: Payer: Self-pay | Admitting: Family Medicine

## 2023-11-26 ENCOUNTER — Other Ambulatory Visit: Payer: Self-pay | Admitting: Family Medicine

## 2023-11-29 ENCOUNTER — Other Ambulatory Visit: Payer: Self-pay | Admitting: Family Medicine

## 2023-11-29 ENCOUNTER — Other Ambulatory Visit: Payer: Self-pay | Admitting: Nurse Practitioner

## 2023-11-29 DIAGNOSIS — R0989 Other specified symptoms and signs involving the circulatory and respiratory systems: Secondary | ICD-10-CM

## 2023-11-29 MED ORDER — LORATADINE 10 MG PO TABS
10.0000 mg | ORAL_TABLET | Freq: Every day | ORAL | 2 refills | Status: AC
Start: 2023-11-29 — End: 2024-11-28

## 2023-11-29 MED ORDER — MONTELUKAST SODIUM 10 MG PO TABS: 10.0000 mg | ORAL_TABLET | Freq: Every day | ORAL | 3 refills | Status: DC

## 2023-11-29 MED ORDER — AMLODIPINE BESYLATE 2.5 MG PO TABS: 2.5000 mg | ORAL_TABLET | Freq: Every day | ORAL | 1 refills | Status: AC

## 2023-11-29 MED ORDER — CITALOPRAM HYDROBROMIDE 40 MG PO TABS: 40.0000 mg | ORAL_TABLET | Freq: Every day | ORAL | 3 refills | Status: DC

## 2023-11-30 ENCOUNTER — Other Ambulatory Visit: Payer: Self-pay | Admitting: Nurse Practitioner

## 2023-11-30 MED ORDER — ROSUVASTATIN CALCIUM 10 MG PO TABS: 10.0000 mg | ORAL_TABLET | Freq: Every day | ORAL | 2 refills | Status: AC

## 2023-11-30 MED ORDER — MONTELUKAST SODIUM 10 MG PO TABS: 10.0000 mg | ORAL_TABLET | Freq: Every day | ORAL | 1 refills | Status: AC

## 2023-11-30 MED ORDER — CITALOPRAM HYDROBROMIDE 40 MG PO TABS: 40.0000 mg | ORAL_TABLET | Freq: Every day | ORAL | 1 refills | Status: AC

## 2023-12-09 ENCOUNTER — Ambulatory Visit: Payer: Medicare Other | Admitting: Nurse Practitioner

## 2023-12-09 ENCOUNTER — Ambulatory Visit: Payer: Medicare Other

## 2023-12-09 NOTE — Progress Notes (Deleted)
 [place], CMA,acting as a Neurosurgeon for Susanna Epley, FNP.,have documented all relevant documentation on the behalf of Susanna Epley, FNP,as directed by  Susanna Epley, FNP while in the presence of Susanna Epley, FNP.  Subjective:  Patient ID: Gilbert Cohen , male    DOB: [birth year] , 43 y.o.   MRN: [id number]  No chief complaint on file.   HPI  HPI   Past Medical History:  Diagnosis Date   Autistic spectrum disorder    Depression    Hyperlipidemia    Hypertension    Intellectual disability    Mental retardation 1982   hypoxic brain injury at birth     Family History  Problem Relation Age of Onset   Hypertension Mother    Cancer Sister 37       localized breast cancer in 2014     Current Outpatient Medications:    amLODipine  (NORVASC ) 2.5 MG tablet, Take 1 tablet (2.5 mg total) by mouth daily., Disp: 90 tablet, Rfl: 1   citalopram  (CELEXA ) 40 MG tablet, Take 1 tablet (40 mg total) by mouth daily., Disp: 90 tablet, Rfl: 1   loratadine  (CLARITIN ) 10 MG tablet, Take 1 tablet (10 mg total) by mouth daily., Disp: 90 tablet, Rfl: 2   montelukast  (SINGULAIR ) 10 MG tablet, Take 1 tablet (10 mg total) by mouth at bedtime., Disp: 90 tablet, Rfl: 1   Omega-3 Fatty Acids (FISH OIL ) 1000 MG CAPS, Two daily, Disp: 60 capsule, Rfl: 3   rosuvastatin  (CRESTOR ) 10 MG tablet, Take 1 tablet (10 mg total) by mouth daily., Disp: 90 tablet, Rfl: 2   Vitamin D , Ergocalciferol , (DRISDOL ) 1.25 MG (50000 UNIT) CAPS capsule, TAKE 1 WEEKLY, Disp: 12 capsule, Rfl: 2   No Known Allergies   Review of Systems   There were no vitals filed for this visit. There is no height or weight on file to calculate BMI.  Wt Readings from Last 3 Encounters:  08/31/23 223 lb (101.2 kg)  07/06/23 216 lb 9.6 oz (98.2 kg)  03/26/23 209 lb 9.6 oz (95.1 kg)    The 10-year ASCVD risk score (Arnett DK, et al., 2019) is: 4.4%   Values used to calculate the score:     Age: 76 years     Sex: Male     Is  Non-Hispanic African American: Yes     Diabetic: No     Tobacco smoker: No     Systolic Blood Pressure: 110 mmHg     Is BP treated: Yes     HDL Cholesterol: 35 mg/dL     Total Cholesterol: 141 mg/dL  Objective:  Physical Exam      Assessment And Plan:  Essential hypertension  Prediabetes    No follow-ups on file.  Patient was given opportunity to ask questions. Patient verbalized understanding of the plan and was able to repeat key elements of the plan. All questions were answered to their satisfaction.    Inge Mangle, FNP, have reviewed all documentation for this visit. The documentation on 12/09/23 for the exam, diagnosis, procedures, and orders are all accurate and complete.   IF YOU HAVE BEEN REFERRED TO A SPECIALIST, IT MAY TAKE 1-2 WEEKS TO SCHEDULE/PROCESS THE REFERRAL. IF YOU HAVE NOT HEARD FROM US /SPECIALIST IN TWO WEEKS, PLEASE GIVE US  A CALL AT [phone].

## 2023-12-11 ENCOUNTER — Ambulatory Visit

## 2023-12-16 ENCOUNTER — Ambulatory Visit: Payer: Medicare Other

## 2023-12-18 ENCOUNTER — Ambulatory Visit

## 2023-12-18 DIAGNOSIS — Z0000 Encounter for general adult medical examination without abnormal findings: Secondary | ICD-10-CM | POA: Diagnosis not present

## 2023-12-18 NOTE — Progress Notes (Signed)
 Subjective:   Gilbert Cohen is a 43 y.o. male who presents for Medicare Annual/Subsequent preventive examination.  Visit Complete: Virtual I connected with  Gilbert Cohen on 12/18/23 by a audio enabled telemedicine application and verified that I am speaking with the correct person using two identifiers.  Patient Location: Home  Provider Location: Office/Clinic  I discussed the limitations of evaluation and management by telemedicine. The patient expressed understanding and agreed to proceed.  Vital Signs: Because this visit was a virtual/telehealth visit, some criteria may be missing or patient reported. Any vitals not documented were not able to be obtained and vitals that have been documented are patient reported.  Patient Medicare AWV questionnaire was completed by the patient on 12/18/23; I have confirmed that all information answered by patient is correct and no changes since this date.        Objective:    There were no vitals filed for this visit. There is no height or weight on file to calculate BMI.     12/02/2022   11:33 AM 06/06/2020    9:34 AM 05/25/2018    2:58 PM 04/08/2017   10:52 PM 04/08/2017    7:09 PM 03/05/2017    6:39 PM  Advanced Directives  Does Patient Have a Medical Advance Directive? No No No  No  No  No   Would patient like information on creating a medical advance directive? No - Patient declined No - Patient declined No - Patient declined  No - Patient declined  No - Patient declined       Data saved with a previous flowsheet row definition    Current Medications (verified) Outpatient Encounter Medications as of 12/18/2023  Medication Sig   amLODipine  (NORVASC ) 2.5 MG tablet Take 1 tablet (2.5 mg total) by mouth daily.   citalopram  (CELEXA ) 40 MG tablet Take 1 tablet (40 mg total) by mouth daily.   loratadine  (CLARITIN ) 10 MG tablet Take 1 tablet (10 mg total) by mouth daily.   montelukast  (SINGULAIR ) 10 MG tablet Take 1 tablet (10 mg  total) by mouth at bedtime.   Omega-3 Fatty Acids (FISH OIL ) 1000 MG CAPS Two daily   rosuvastatin  (CRESTOR ) 10 MG tablet Take 1 tablet (10 mg total) by mouth daily.   Vitamin D , Ergocalciferol , (DRISDOL ) 1.25 MG (50000 UNIT) CAPS capsule TAKE 1 WEEKLY   No facility-administered encounter medications on file as of 12/18/2023.    Allergies (verified) Patient has no known allergies.   History: Past Medical History:  Diagnosis Date   Autistic spectrum disorder    Depression    Hyperlipidemia    Hypertension    Intellectual disability    Mental retardation 1982   hypoxic brain injury at birth   No past surgical history on file. Family History  Problem Relation Age of Onset   Hypertension Mother    Cancer Sister 56       localized breast cancer in 2014   Social History   Socioeconomic History   Marital status: Single    Spouse name: N/A   Number of children: 0   Years of education: 12   Highest education level: Not on file  Occupational History   Occupation: disabled   Tobacco Use   Smoking status: Never   Smokeless tobacco: Never  Vaping Use   Vaping status: Never Used  Substance and Sexual Activity   Alcohol use: No   Drug use: No   Sexual activity: Not Currently  Other Topics Concern  Not on file  Social History Narrative   Lives in a group home    Social Drivers of Health   Financial Resource Strain: Low Risk  (12/02/2022)   Overall Financial Resource Strain (CARDIA)    Difficulty of Paying Living Expenses: Not hard at all  Food Insecurity: No Food Insecurity (12/02/2022)   Hunger Vital Sign    Worried About Running Out of Food in the Last Year: Never true    Ran Out of Food in the Last Year: Never true  Transportation Needs: No Transportation Needs (12/02/2022)   PRAPARE - Administrator, Civil Service (Medical): No    Lack of Transportation (Non-Medical): No  Physical Activity: Inactive (12/02/2022)   Exercise Vital Sign    Days of Exercise  per Week: 0 days    Minutes of Exercise per Session: 0 min  Stress: No Stress Concern Present (12/02/2022)   [employer] of Occupational Health - Occupational Stress Questionnaire    Feeling of Stress : Not at all  Social Connections: Unknown (12/02/2022)   Social Connection and Isolation Panel    Frequency of Communication with Friends and Family: Once a week    Frequency of Social Gatherings with Friends and Family: Once a week    Attends Religious Services: Never    Database administrator or Organizations: No    Attends Banker Meetings: Never    Marital Status: Patient unable to answer    Tobacco Counseling Counseling given: Not Answered   Clinical Intake:                        Activities of Daily Living     No data to display          Patient Care Team: Susanna Epley, FNP as PCP - General (General Practice)  Indicate any recent Medical Services you may have received from other than [facility] providers in the past year (date may be approximate).     Assessment:   This is a routine wellness examination for [place].  Hearing/Vision screen No results found.   Goals Addressed   None    Depression Screen    07/06/2023   10:02 AM 03/26/2023    2:22 PM 12/02/2022   11:33 AM 12/02/2022   11:32 AM 11/26/2022   11:25 AM 07/18/2022   10:34 AM 05/16/2022    8:33 AM  PHQ 2/9 Scores  PHQ - 2 Score 4 5 0 0 0 1 0  PHQ- 9 Score 4 11         Fall Risk    07/06/2023   10:02 AM 03/26/2023    2:22 PM 12/02/2022   11:33 AM 11/26/2022   11:25 AM 07/18/2022   10:33 AM  Fall Risk   Falls in the past year? 0 0 0 0 0  Number falls in past yr: 0 0 0 0 0  Injury with Fall? 0 0 0 0 0  Risk for fall due to : No Fall Risks No Fall Risks No Fall Risks No Fall Risks No Fall Risks  Follow up Falls evaluation completed Falls evaluation completed Falls evaluation completed Falls evaluation completed Falls evaluation completed      Data saved with a  previous flowsheet row definition    MEDICARE RISK AT HOME:    TIMED UP AND GO:  Was the test performed?  No    Cognitive Function:    12/02/2022   11:33 AM  MMSE - Mini Mental State Exam  Not completed: Unable to complete        12/02/2022   11:33 AM 08/22/2021    4:33 PM 06/06/2020    9:37 AM 05/27/2019   10:38 AM 05/25/2018    2:59 PM  6CIT Screen  What Year? 0 points 0 points 0 points 0 points 0 points  What month? 0 points 0 points 0 points 0 points 0 points  What time? 0 points 0 points 0 points 0 points 0 points  Count back from 20 0 points 0 points 0 points 0 points 0 points  Months in reverse 0 points 0 points 0 points 0 points 0 points  Repeat phrase 0 points 0 points 0 points 2 points 0 points  Total Score 0 points 0 points 0 points 2 points 0 points    Immunizations Immunization History  Administered Date(s) Administered   Influenza, Seasonal, Injecte, Preservative Fre 03/26/2023   Influenza,inj,Quad PF,6+ Mos 05/18/2013, 04/14/2014, 04/24/2015, 05/07/2016, 05/14/2017, 03/15/2018, 04/23/2020, 04/24/2021, 04/15/2022   PPD Test 07/13/2017, 10/13/2017   Pfizer(Comirnaty)Fall Seasonal Vaccine 12 years and older 04/15/2022   Tdap 12/06/2012, 03/26/2023    TDAP status: Up to date  Flu Vaccine status: Up to date   Covid-19 vaccine status: Information provided on how to obtain vaccines.   Qualifies for Shingles Vaccine? No   Zostavax completed No   Shingrix Completed?: No.    Education has been provided regarding the importance of this vaccine. Patient has been advised to call insurance company to determine out of pocket expense if they have not yet received this vaccine. Advised may also receive vaccine at local pharmacy or Health Dept. Verbalized acceptance and understanding.  Screening Tests Health Maintenance  Topic Date Due   HPV VACCINES (1 - Male 3-dose series) Never done   Medicare Annual Wellness (AWV)  12/02/2023   INFLUENZA VACCINE  02/05/2024    DTaP/Tdap/Td (3 - Td or Tdap) 03/25/2033   Hepatitis C Screening  Completed   HIV Screening  Completed   Meningococcal B Vaccine  Aged Out   COVID-19 Vaccine  Discontinued    Health Maintenance  Health Maintenance Due  Topic Date Due   HPV VACCINES (1 - Male 3-dose series) Never done   Medicare Annual Wellness (AWV)  12/02/2023    Colorectal cancer screening: No longer required.   Lung Cancer Screening: (Low Dose CT Chest recommended if Age 49-80 years, 20 pack-year currently smoking OR have quit w/in 15years.) does not qualify.   Lung Cancer Screening Referral: n/a  Additional Screening:  Hepatitis C Screening: does qualify; Completed 10/18/2020  Vision Screening: Recommended annual ophthalmology exams for early detection of glaucoma and other disorders of the eye. Is the patient up to date with their annual eye exam?  Yes  Who is the provider or what is the name of the office in which the patient attends annual eye exams? Unsure of the name If pt is not established with a provider, would they like to be referred to a provider to establish care? No .   Dental Screening: Recommended annual dental exams for proper oral hygiene  Diabetic Foot Exam: no DM  Community Resource Referral / Chronic Care Management: CRR required this visit?  No   CCM required this visit?  No     Plan:     I have personally reviewed and noted the following in the patient's chart:   Medical and social history Use of alcohol, tobacco or illicit drugs  Current medications and supplements including opioid prescriptions. Patient is not currently taking opioid prescriptions. Functional ability and status Nutritional status Physical activity Advanced directives List of other physicians Hospitalizations, surgeries, and ER visits in previous 12 months Vitals Screenings to include cognitive, depression, and falls Referrals and appointments  In addition, I have reviewed and discussed with  patient certain preventive protocols, quality metrics, and best practice recommendations. A written personalized care plan for preventive services as well as general preventive health recommendations were provided to patient.     Alexis Anger, CMA   12/18/2023   After Visit Summary: (Declined) Due to this being a telephonic visit, with patients personalized plan was offered to patient but patient Declined AVS at this time   Nurse Notes:

## 2024-02-20 ENCOUNTER — Other Ambulatory Visit: Payer: Self-pay | Admitting: Family Medicine
